# Patient Record
Sex: Female | Born: 1970 | Race: White | Hispanic: No | Smoking: Current every day smoker
Health system: Southern US, Community
[De-identification: ages and names within clinical notes are randomized; demographics above are authoritative.]

## PROBLEM LIST (undated history)

## (undated) DIAGNOSIS — F1910 Other psychoactive substance abuse, uncomplicated: Secondary | ICD-10-CM

## (undated) DIAGNOSIS — I10 Essential (primary) hypertension: Secondary | ICD-10-CM

## (undated) DIAGNOSIS — M545 Low back pain, unspecified: Secondary | ICD-10-CM

## (undated) DIAGNOSIS — G609 Hereditary and idiopathic neuropathy, unspecified: Secondary | ICD-10-CM

## (undated) DIAGNOSIS — M1990 Unspecified osteoarthritis, unspecified site: Secondary | ICD-10-CM

## (undated) DIAGNOSIS — M170 Bilateral primary osteoarthritis of knee: Secondary | ICD-10-CM

## (undated) DIAGNOSIS — J439 Emphysema, unspecified: Secondary | ICD-10-CM

## (undated) DIAGNOSIS — F419 Anxiety disorder, unspecified: Secondary | ICD-10-CM

## (undated) DIAGNOSIS — K219 Gastro-esophageal reflux disease without esophagitis: Secondary | ICD-10-CM

## (undated) DIAGNOSIS — N189 Chronic kidney disease, unspecified: Secondary | ICD-10-CM

## (undated) DIAGNOSIS — E119 Type 2 diabetes mellitus without complications: Secondary | ICD-10-CM

## (undated) DIAGNOSIS — G709 Myoneural disorder, unspecified: Secondary | ICD-10-CM

## (undated) DIAGNOSIS — I639 Cerebral infarction, unspecified: Secondary | ICD-10-CM

## (undated) DIAGNOSIS — L739 Follicular disorder, unspecified: Secondary | ICD-10-CM

## (undated) DIAGNOSIS — N3941 Urge incontinence: Secondary | ICD-10-CM

## (undated) DIAGNOSIS — E785 Hyperlipidemia, unspecified: Secondary | ICD-10-CM

## (undated) DIAGNOSIS — D890 Polyclonal hypergammaglobulinemia: Secondary | ICD-10-CM

## (undated) DIAGNOSIS — D72829 Elevated white blood cell count, unspecified: Secondary | ICD-10-CM

## (undated) DIAGNOSIS — R700 Elevated erythrocyte sedimentation rate: Secondary | ICD-10-CM

## (undated) DIAGNOSIS — J449 Chronic obstructive pulmonary disease, unspecified: Secondary | ICD-10-CM

## (undated) DIAGNOSIS — M7580 Other shoulder lesions, unspecified shoulder: Secondary | ICD-10-CM

## (undated) DIAGNOSIS — D759 Disease of blood and blood-forming organs, unspecified: Secondary | ICD-10-CM

## (undated) HISTORY — DX: Other psychoactive substance abuse, uncomplicated: F19.10

## (undated) HISTORY — DX: Type 2 diabetes mellitus without complications: E11.9

## (undated) HISTORY — DX: Disease of blood and blood-forming organs, unspecified: D75.9

## (undated) HISTORY — DX: Anxiety disorder, unspecified: F41.9

## (undated) HISTORY — DX: Hyperlipidemia, unspecified: E78.5

## (undated) HISTORY — DX: Low back pain, unspecified: M54.50

## (undated) HISTORY — DX: Urge incontinence: N39.41

## (undated) HISTORY — DX: Elevated erythrocyte sedimentation rate: R70.0

## (undated) HISTORY — DX: Bilateral primary osteoarthritis of knee: M17.0

## (undated) HISTORY — PX: CORNEAL TRANSPLANT: SHX108

## (undated) HISTORY — PX: CHOLECYSTECTOMY: SHX55

## (undated) HISTORY — DX: Cerebral infarction, unspecified: I63.9

## (undated) HISTORY — DX: Follicular disorder, unspecified: L73.9

## (undated) HISTORY — DX: Hereditary and idiopathic neuropathy, unspecified: G60.9

## (undated) HISTORY — DX: Emphysema, unspecified: J43.9

## (undated) HISTORY — DX: Myoneural disorder, unspecified: G70.9

## (undated) HISTORY — DX: Chronic kidney disease, unspecified: N18.9

## (undated) HISTORY — DX: Unspecified osteoarthritis, unspecified site: M19.90

## (undated) HISTORY — PX: DILATION AND CURETTAGE OF UTERUS: SHX78

## (undated) HISTORY — DX: Gastro-esophageal reflux disease without esophagitis: K21.9

## (undated) HISTORY — DX: Polyclonal hypergammaglobulinemia: D89.0

## (undated) HISTORY — DX: Essential (primary) hypertension: I10

## (undated) HISTORY — DX: Chronic obstructive pulmonary disease, unspecified: J44.9

## (undated) HISTORY — DX: Elevated white blood cell count, unspecified: D72.829

## (undated) HISTORY — DX: Low back pain: M54.5

## (undated) HISTORY — DX: Other shoulder lesions, unspecified shoulder: M75.80

## (undated) HISTORY — PX: TONSILECTOMY, ADENOIDECTOMY, BILATERAL MYRINGOTOMY AND TUBES: SHX2538

---

## 2005-01-23 ENCOUNTER — Emergency Department (HOSPITAL_COMMUNITY): Admission: EM | Admit: 2005-01-23 | Discharge: 2005-01-23 | Payer: Self-pay | Admitting: Emergency Medicine

## 2005-03-03 ENCOUNTER — Emergency Department (HOSPITAL_COMMUNITY): Admission: EM | Admit: 2005-03-03 | Discharge: 2005-03-03 | Payer: Self-pay | Admitting: Emergency Medicine

## 2005-05-18 ENCOUNTER — Emergency Department (HOSPITAL_COMMUNITY): Admission: EM | Admit: 2005-05-18 | Discharge: 2005-05-18 | Payer: Self-pay | Admitting: Emergency Medicine

## 2005-05-25 ENCOUNTER — Observation Stay (HOSPITAL_COMMUNITY): Admission: EM | Admit: 2005-05-25 | Discharge: 2005-05-26 | Payer: Self-pay | Admitting: Emergency Medicine

## 2005-06-21 ENCOUNTER — Emergency Department: Payer: Self-pay | Admitting: Emergency Medicine

## 2005-07-18 ENCOUNTER — Emergency Department (HOSPITAL_COMMUNITY): Admission: EM | Admit: 2005-07-18 | Discharge: 2005-07-18 | Payer: Self-pay | Admitting: Emergency Medicine

## 2005-07-20 ENCOUNTER — Ambulatory Visit: Payer: Self-pay

## 2005-08-22 ENCOUNTER — Emergency Department: Payer: Self-pay | Admitting: Emergency Medicine

## 2005-08-24 ENCOUNTER — Emergency Department: Payer: Self-pay | Admitting: Unknown Physician Specialty

## 2005-09-21 ENCOUNTER — Emergency Department: Payer: Self-pay | Admitting: Emergency Medicine

## 2005-12-13 ENCOUNTER — Emergency Department: Payer: Self-pay | Admitting: Emergency Medicine

## 2006-02-13 ENCOUNTER — Emergency Department: Payer: Self-pay | Admitting: Internal Medicine

## 2006-02-14 ENCOUNTER — Ambulatory Visit: Payer: Self-pay | Admitting: Internal Medicine

## 2006-03-25 ENCOUNTER — Emergency Department: Payer: Self-pay | Admitting: Emergency Medicine

## 2006-03-25 ENCOUNTER — Other Ambulatory Visit: Payer: Self-pay

## 2006-03-29 ENCOUNTER — Emergency Department: Payer: Self-pay | Admitting: Emergency Medicine

## 2006-06-08 ENCOUNTER — Emergency Department: Payer: Self-pay | Admitting: Emergency Medicine

## 2006-06-13 ENCOUNTER — Other Ambulatory Visit: Payer: Self-pay

## 2006-06-13 ENCOUNTER — Emergency Department: Payer: Self-pay | Admitting: Emergency Medicine

## 2006-07-29 ENCOUNTER — Ambulatory Visit: Payer: Self-pay | Admitting: Ophthalmology

## 2006-12-27 ENCOUNTER — Emergency Department: Payer: Self-pay | Admitting: Emergency Medicine

## 2007-02-24 ENCOUNTER — Emergency Department: Payer: Self-pay

## 2007-02-24 ENCOUNTER — Other Ambulatory Visit: Payer: Self-pay

## 2007-03-20 ENCOUNTER — Ambulatory Visit: Payer: Self-pay | Admitting: Rheumatology

## 2007-03-30 ENCOUNTER — Ambulatory Visit: Payer: Self-pay | Admitting: Internal Medicine

## 2007-04-05 ENCOUNTER — Emergency Department: Payer: Self-pay | Admitting: Emergency Medicine

## 2007-04-10 ENCOUNTER — Ambulatory Visit: Payer: Self-pay | Admitting: Internal Medicine

## 2007-04-29 ENCOUNTER — Ambulatory Visit: Payer: Self-pay | Admitting: Internal Medicine

## 2007-05-17 ENCOUNTER — Emergency Department: Payer: Self-pay | Admitting: Emergency Medicine

## 2007-05-29 ENCOUNTER — Emergency Department: Payer: Self-pay

## 2007-05-30 ENCOUNTER — Ambulatory Visit: Payer: Self-pay | Admitting: Internal Medicine

## 2007-08-07 ENCOUNTER — Emergency Department: Payer: Self-pay | Admitting: Emergency Medicine

## 2007-11-14 ENCOUNTER — Emergency Department: Payer: Self-pay | Admitting: Emergency Medicine

## 2007-12-06 ENCOUNTER — Ambulatory Visit: Payer: Self-pay

## 2008-01-28 ENCOUNTER — Emergency Department: Payer: Self-pay | Admitting: Emergency Medicine

## 2008-04-22 ENCOUNTER — Other Ambulatory Visit: Payer: Self-pay

## 2008-04-22 ENCOUNTER — Emergency Department: Payer: Self-pay | Admitting: Internal Medicine

## 2008-05-01 ENCOUNTER — Emergency Department: Payer: Self-pay

## 2008-07-27 ENCOUNTER — Emergency Department: Payer: Self-pay | Admitting: Emergency Medicine

## 2008-07-27 ENCOUNTER — Ambulatory Visit: Payer: Self-pay | Admitting: Family Medicine

## 2008-08-17 ENCOUNTER — Emergency Department: Payer: Self-pay | Admitting: Unknown Physician Specialty

## 2008-08-20 ENCOUNTER — Emergency Department: Payer: Self-pay | Admitting: Emergency Medicine

## 2008-09-06 ENCOUNTER — Ambulatory Visit: Payer: Self-pay | Admitting: Orthopedic Surgery

## 2008-11-30 ENCOUNTER — Emergency Department: Payer: Self-pay | Admitting: Emergency Medicine

## 2008-12-06 ENCOUNTER — Emergency Department: Payer: Self-pay

## 2008-12-07 ENCOUNTER — Ambulatory Visit: Payer: Self-pay | Admitting: Family Medicine

## 2009-02-08 ENCOUNTER — Ambulatory Visit: Payer: Self-pay | Admitting: Family Medicine

## 2009-02-26 ENCOUNTER — Ambulatory Visit: Payer: Self-pay | Admitting: Family Medicine

## 2009-05-29 ENCOUNTER — Ambulatory Visit: Payer: Self-pay | Admitting: Internal Medicine

## 2009-08-01 ENCOUNTER — Emergency Department: Payer: Self-pay | Admitting: Emergency Medicine

## 2009-10-22 ENCOUNTER — Emergency Department: Payer: Self-pay | Admitting: Emergency Medicine

## 2009-12-09 ENCOUNTER — Emergency Department: Payer: Self-pay | Admitting: Emergency Medicine

## 2010-03-02 ENCOUNTER — Ambulatory Visit: Payer: Self-pay | Admitting: Unknown Physician Specialty

## 2010-03-08 ENCOUNTER — Ambulatory Visit: Payer: Self-pay | Admitting: Unknown Physician Specialty

## 2010-05-20 ENCOUNTER — Emergency Department: Payer: Self-pay | Admitting: Emergency Medicine

## 2010-06-29 ENCOUNTER — Ambulatory Visit: Payer: Self-pay | Admitting: Internal Medicine

## 2010-10-03 ENCOUNTER — Emergency Department: Payer: Self-pay | Admitting: Emergency Medicine

## 2011-01-27 ENCOUNTER — Emergency Department: Payer: Self-pay | Admitting: Emergency Medicine

## 2011-02-05 ENCOUNTER — Emergency Department: Payer: Self-pay | Admitting: Emergency Medicine

## 2011-02-28 ENCOUNTER — Emergency Department: Payer: Self-pay | Admitting: Emergency Medicine

## 2011-07-22 ENCOUNTER — Emergency Department: Payer: Self-pay | Admitting: *Deleted

## 2012-11-10 ENCOUNTER — Emergency Department: Payer: Self-pay | Admitting: Emergency Medicine

## 2012-11-18 ENCOUNTER — Inpatient Hospital Stay: Payer: Self-pay | Admitting: Internal Medicine

## 2012-11-18 LAB — URINALYSIS, COMPLETE
Bilirubin,UR: NEGATIVE
Blood: NEGATIVE
Glucose,UR: >=500 mg/dL (ref 0–75)
Leukocyte Esterase: NEGATIVE
Ph: 6 (ref 4.5–8.0)
RBC,UR: <1 /HPF (ref 0–5)
Squamous Epithelial: 2
WBC UR: 1 /HPF (ref 0–5)

## 2012-11-18 LAB — CBC WITH DIFFERENTIAL/PLATELET
Lymphocyte #: 2.7 10*3/uL (ref 1.0–3.6)
MCH: 25.5 pg — ABNORMAL LOW (ref 26.0–34.0)
MCV: 77 fL — ABNORMAL LOW (ref 80–100)
RBC: 5.16 10*6/uL (ref 3.80–5.20)
WBC: 13.4 10*3/uL — ABNORMAL HIGH (ref 3.6–11.0)

## 2012-11-18 LAB — COMPREHENSIVE METABOLIC PANEL
Albumin: 2.9 g/dL — ABNORMAL LOW (ref 3.4–5.0)
Alkaline Phosphatase: 167 U/L — ABNORMAL HIGH (ref 50–136)
Anion Gap: 7 (ref 7–16)
Bilirubin,Total: 0.3 mg/dL (ref 0.2–1.0)
Chloride: 105 mmol/L (ref 98–107)
Potassium: 4.5 mmol/L (ref 3.5–5.1)
SGOT(AST): 23 U/L (ref 15–37)

## 2012-11-18 LAB — HEMOGLOBIN A1C: Hemoglobin A1C: 9.3 % — ABNORMAL HIGH (ref 4.2–6.3)

## 2012-11-19 LAB — CBC WITH DIFFERENTIAL/PLATELET
Eosinophil %: 5 %
HGB: 11.9 g/dL — ABNORMAL LOW (ref 12.0–16.0)
Monocyte #: 0.7 x10 3/mm (ref 0.2–0.9)
Neutrophil %: 65.3 %
WBC: 9.5 10*3/uL (ref 3.6–11.0)

## 2012-11-19 LAB — BASIC METABOLIC PANEL
BUN: 18 mg/dL (ref 7–18)
Calcium, Total: 8.7 mg/dL (ref 8.5–10.1)
Creatinine: 1.17 mg/dL (ref 0.60–1.30)
Glucose: 200 mg/dL — ABNORMAL HIGH (ref 65–99)
Osmolality: 281 (ref 275–301)
Potassium: 4.4 mmol/L (ref 3.5–5.1)

## 2012-11-23 LAB — CULTURE, BLOOD (SINGLE)

## 2013-03-02 ENCOUNTER — Ambulatory Visit: Payer: Self-pay | Admitting: Neurology

## 2013-03-04 ENCOUNTER — Encounter: Payer: Self-pay | Admitting: Family Medicine

## 2013-03-04 ENCOUNTER — Ambulatory Visit (INDEPENDENT_AMBULATORY_CARE_PROVIDER_SITE_OTHER): Payer: Medicare Other | Admitting: Family Medicine

## 2013-03-04 VITALS — BP 140/85 | HR 79 | Ht 65.0 in | Wt 253.0 lb

## 2013-03-04 DIAGNOSIS — F419 Anxiety disorder, unspecified: Secondary | ICD-10-CM

## 2013-03-04 DIAGNOSIS — E66813 Obesity, class 3: Secondary | ICD-10-CM

## 2013-03-04 DIAGNOSIS — F17209 Nicotine dependence, unspecified, with unspecified nicotine-induced disorders: Secondary | ICD-10-CM

## 2013-03-04 DIAGNOSIS — E1140 Type 2 diabetes mellitus with diabetic neuropathy, unspecified: Secondary | ICD-10-CM

## 2013-03-04 DIAGNOSIS — F17200 Nicotine dependence, unspecified, uncomplicated: Secondary | ICD-10-CM

## 2013-03-04 DIAGNOSIS — M1990 Unspecified osteoarthritis, unspecified site: Secondary | ICD-10-CM

## 2013-03-04 DIAGNOSIS — N059 Unspecified nephritic syndrome with unspecified morphologic changes: Secondary | ICD-10-CM

## 2013-03-04 DIAGNOSIS — F1290 Cannabis use, unspecified, uncomplicated: Secondary | ICD-10-CM

## 2013-03-04 DIAGNOSIS — E119 Type 2 diabetes mellitus without complications: Secondary | ICD-10-CM

## 2013-03-04 DIAGNOSIS — F1210 Cannabis abuse, uncomplicated: Secondary | ICD-10-CM

## 2013-03-04 DIAGNOSIS — M129 Arthropathy, unspecified: Secondary | ICD-10-CM

## 2013-03-04 DIAGNOSIS — T868499 Unspecified complication of corneal transplant, unspecified eye: Secondary | ICD-10-CM

## 2013-03-04 DIAGNOSIS — Z8742 Personal history of other diseases of the female genital tract: Secondary | ICD-10-CM

## 2013-03-04 DIAGNOSIS — I6350 Cerebral infarction due to unspecified occlusion or stenosis of unspecified cerebral artery: Secondary | ICD-10-CM

## 2013-03-04 DIAGNOSIS — D759 Disease of blood and blood-forming organs, unspecified: Secondary | ICD-10-CM

## 2013-03-04 DIAGNOSIS — K219 Gastro-esophageal reflux disease without esophagitis: Secondary | ICD-10-CM

## 2013-03-04 DIAGNOSIS — N920 Excessive and frequent menstruation with regular cycle: Secondary | ICD-10-CM

## 2013-03-04 DIAGNOSIS — E1142 Type 2 diabetes mellitus with diabetic polyneuropathy: Secondary | ICD-10-CM

## 2013-03-04 DIAGNOSIS — F411 Generalized anxiety disorder: Secondary | ICD-10-CM

## 2013-03-04 DIAGNOSIS — E1149 Type 2 diabetes mellitus with other diabetic neurological complication: Secondary | ICD-10-CM

## 2013-03-04 DIAGNOSIS — J449 Chronic obstructive pulmonary disease, unspecified: Secondary | ICD-10-CM

## 2013-03-04 DIAGNOSIS — N946 Dysmenorrhea, unspecified: Secondary | ICD-10-CM

## 2013-03-04 DIAGNOSIS — I639 Cerebral infarction, unspecified: Secondary | ICD-10-CM

## 2013-03-04 DIAGNOSIS — I10 Essential (primary) hypertension: Secondary | ICD-10-CM

## 2013-03-04 DIAGNOSIS — N289 Disorder of kidney and ureter, unspecified: Secondary | ICD-10-CM

## 2013-03-04 NOTE — Patient Instructions (Signed)
Menorrhagia Dysfunctional uterine bleeding is different from a normal menstrual period. When periods are heavy or there is more bleeding than is usual for you, it is called menorrhagia. It may be caused by hormonal imbalance, or physical, metabolic, or other problems. Examination is necessary in order that your caregiver may treat treatable causes. If this is a continuing problem, a D&C may be needed. That means that the cervix (the opening of the uterus or womb) is dilated (stretched larger) and the lining of the uterus is scraped out. The tissue scraped out is then examined under a microscope by a specialist (pathologist) to make sure there is nothing of concern that needs further or more extensive treatment. HOME CARE INSTRUCTIONS   If medications were prescribed, take exactly as directed. Do not change or switch medications without consulting your caregiver.  Long term heavy bleeding may result in iron deficiency. Your caregiver may have prescribed iron pills. They help replace the iron your body lost from heavy bleeding. Take exactly as directed. Iron may cause constipation. If this becomes a problem, increase the bran, fruits, and roughage in your diet.  Do not take aspirin or medicines that contain aspirin one week before or during your menstrual period. Aspirin may make the bleeding worse.  If you need to change your sanitary pad or tampon more than once every 2 hours, stay in bed and rest as much as possible until the bleeding stops.  Eat well-balanced meals. Eat foods high in iron. Examples are leafy green vegetables, meat, liver, eggs, and whole grain breads and cereals. Do not try to lose weight until the abnormal bleeding has stopped and your blood iron level is back to normal. SEEK MEDICAL CARE IF:   You need to change your sanitary pad or tampon more than once an hour.  You develop nausea (feeling sick to your stomach) and vomiting, dizziness, or diarrhea while you are taking your  medicine.  You have any problems that may be related to the medicine you are taking. SEEK IMMEDIATE MEDICAL CARE IF:   You have a fever.  You develop chills.  You develop severe bleeding or start to pass blood clots.  You feel dizzy or faint. MAKE SURE YOU:   Understand these instructions.  Will watch your condition.  Will get help right away if you are not doing well or get worse. Document Released: 10/15/2005 Document Revised: 01/07/2012 Document Reviewed: 06/04/2008 [facility] Patient Information 2013 [place], [place]. Levonorgestrel intrauterine device (IUD) What is this medicine? LEVONORGESTREL IUD (LEE voe nor jes trel) is a contraceptive (birth control) device. The device is placed inside the uterus by a healthcare professional. It is used to prevent pregnancy and can also be used to treat heavy bleeding that occurs during your period. Depending on the device, it can be used for 3 to 5 years. This medicine may be used for other purposes; ask your health care provider or pharmacist if you have questions. What should I tell my health care provider before I take this medicine? They need to know if you have any of these conditions: -abnormal Pap smear -cancer of the breast, uterus, or cervix -diabetes -endometritis -genital or pelvic infection now or in the past -have more than one sexual partner or your partner has more than one partner -heart disease -history of an ectopic or tubal pregnancy -immune system problems -IUD in place -liver disease or tumor -problems with blood clots or take blood-thinners -use intravenous drugs -uterus of unusual shape -vaginal bleeding that  has not been explained -an unusual or allergic reaction to levonorgestrel, other hormones, silicone, or polyethylene, medicines, foods, dyes, or preservatives -pregnant or trying to get pregnant -breast-feeding How should I use this medicine? This device is placed inside the uterus by a health care  professional. Talk to your pediatrician regarding the use of this medicine in children. Special care may be needed. Overdosage: If you think you have taken too much of this medicine contact a poison control center or emergency room at once. NOTE: This medicine is only for you. Do not share this medicine with others. What if I miss a dose? This does not apply. What may interact with this medicine? Do not take this medicine with any of the following medications: -amprenavir -bosentan -fosamprenavir This medicine may also interact with the following medications: -aprepitant -barbiturate medicines for inducing sleep or treating seizures -bexarotene -griseofulvin -medicines to treat seizures like carbamazepine, ethotoin, felbamate, oxcarbazepine, phenytoin, topiramate -modafinil -pioglitazone -rifabutin -rifampin -rifapentine -some medicines to treat HIV infection like atazanavir, indinavir, lopinavir, nelfinavir, tipranavir, ritonavir -St. John's wort -warfarin This list may not describe all possible interactions. Give your health care provider a list of all the medicines, herbs, non-prescription drugs, or dietary supplements you use. Also tell them if you smoke, drink alcohol, or use illegal drugs. Some items may interact with your medicine. What should I watch for while using this medicine? Visit your doctor or health care professional for regular check ups. See your doctor if you or your partner has sexual contact with others, becomes HIV positive, or gets a sexual transmitted disease. This product does not protect you against HIV infection (AIDS) or other sexually transmitted diseases. You can check the placement of the IUD yourself by reaching up to the top of your vagina with clean fingers to feel the threads. Do not pull on the threads. It is a good habit to check placement after each menstrual period. Call your doctor right away if you feel more of the IUD than just the threads or if  you cannot feel the threads at all. The IUD may come out by itself. You may become pregnant if the device comes out. If you notice that the IUD has come out use a backup birth control method like condoms and call your health care provider. Using tampons will not change the position of the IUD and are okay to use during your period. What side effects may I notice from receiving this medicine? Side effects that you should report to your doctor or health care professional as soon as possible: -allergic reactions like skin rash, itching or hives, swelling of the face, lips, or tongue -fever, flu-like symptoms -genital sores -high blood pressure -no menstrual period for 6 weeks during use -pain, swelling, warmth in the leg -pelvic pain or tenderness -severe or sudden headache -signs of pregnancy -stomach cramping -sudden shortness of breath -trouble with balance, talking, or walking -unusual vaginal bleeding, discharge -yellowing of the eyes or skin Side effects that usually do not require medical attention (report to your doctor or health care professional if they continue or are bothersome): -acne -breast pain -change in sex drive or performance -changes in weight -cramping, dizziness, or faintness while the device is being inserted -headache -irregular menstrual bleeding within first 3 to 6 months of use -nausea This list may not describe all possible side effects. Call your doctor for medical advice about side effects. You may report side effects to FDA at 1-800-FDA-1088. Where should I keep my medicine?  This does not apply. NOTE: This sheet is a summary. It may not cover all possible information. If you have questions about this medicine, talk to your doctor, pharmacist, or health care provider.  2013, [employer]vier/Gold Standard. (11/15/2011 1:54:04 PM) Hysterectomy Information  A hysterectomy is a procedure where your uterus is surgically removed. It will no longer be possible to have  menstrual periods or to become pregnant. The tubes and ovaries can be removed (bilateral salpingo-oopherectomy) during this surgery as well.  REASONS FOR A HYSTERECTOMY  Persistent, abnormal bleeding.  Lasting (chronic) pelvic pain or infection.  The lining of the uterus (endometrium) starts growing outside the uterus (endometriosis).  The endometrium starts growing in the muscle of the uterus (adenomyosis).  The uterus falls down into the vagina (pelvic organ prolapse).  Symptomatic uterine fibroids.  Precancerous cells.  Cervical cancer or uterine cancer. TYPES OF HYSTERECTOMIES  Supracervical hysterectomy. This type removes the top part of the uterus, but not the cervix.  Total hysterectomy. This type removes the uterus and cervix.  Radical hysterectomy. This type removes the uterus, cervix, and the fibrous tissue that holds the uterus in place in the pelvis (parametrium). WAYS A HYSTERECTOMY CAN BE PERFORMED  Abdominal hysterectomy. A large surgical cut (incision) is made in the abdomen. The uterus is removed through this incision.  Vaginal hysterectomy. An incision is made in the vagina. The uterus is removed through this incision. There are no abdominal incisions.  Conventional laparoscopic hysterectomy. A thin, lighted tube with a camera (laparoscope) is inserted into 3 or 4 small incisions in the abdomen. The uterus is cut into small pieces. The small pieces are removed through the incisions, or they are removed through the vagina.  Laparoscopic assisted vaginal hysterectomy (LAVH). Three or four small incisions are made in the abdomen. Part of the surgery is performed laparoscopically and part vaginally. The uterus is removed through the vagina.  Robot-assisted laparoscopic hysterectomy. A laparoscope is inserted into 3 or 4 small incisions in the abdomen. A computer-controlled device is used to give the surgeon a 3D image. This allows for more precise movements of  surgical instruments. The uterus is cut into small pieces and removed through the incisions or removed through the vagina. RISKS OF HYSTERECTOMY   Bleeding and risk of blood transfusion. Tell your caregiver if you do not want to receive any blood products.  Blood clots in the legs or lung.  Infection.  Injury to surrounding organs.  Anesthesia problems or side effects.  Conversion to an abdominal hysterectomy. WHAT TO EXPECT AFTER A HYSTERECTOMY  You will be given pain medicine.  You will need to have someone with you for the first 3 to 5 days after you go home.  You will need to follow up with your surgeon in 2 to 4 weeks after surgery to evaluate your progress.  You may have early menopause symptoms like hot flashes, night sweats, and insomnia.  If you had a hysterectomy for a problem that was not a cancer or a condition that could lead to cancer, then you no longer need Pap tests. However, even if you no longer need a Pap test, a regular exam is a good idea to make sure no other problems are starting. Document Released: 04/10/2001 Document Revised: 01/07/2012 Document Reviewed: 05/26/2011 [facility] Patient Information 2013 [place], [place].

## 2013-03-04 NOTE — Progress Notes (Signed)
Periods are becoming heavier and more painful over the last two years.  Seen at [facility] and discussed hysterectomy,  She was afraid at that point but feels like it is getting worse and would like to discuss her options.

## 2013-03-05 DIAGNOSIS — I10 Essential (primary) hypertension: Secondary | ICD-10-CM | POA: Insufficient documentation

## 2013-03-05 DIAGNOSIS — J449 Chronic obstructive pulmonary disease, unspecified: Secondary | ICD-10-CM | POA: Insufficient documentation

## 2013-03-05 DIAGNOSIS — F419 Anxiety disorder, unspecified: Secondary | ICD-10-CM | POA: Insufficient documentation

## 2013-03-05 DIAGNOSIS — E119 Type 2 diabetes mellitus without complications: Secondary | ICD-10-CM | POA: Insufficient documentation

## 2013-03-05 DIAGNOSIS — N946 Dysmenorrhea, unspecified: Secondary | ICD-10-CM | POA: Insufficient documentation

## 2013-03-05 DIAGNOSIS — I639 Cerebral infarction, unspecified: Secondary | ICD-10-CM | POA: Insufficient documentation

## 2013-03-05 DIAGNOSIS — K219 Gastro-esophageal reflux disease without esophagitis: Secondary | ICD-10-CM | POA: Insufficient documentation

## 2013-03-05 DIAGNOSIS — E1140 Type 2 diabetes mellitus with diabetic neuropathy, unspecified: Secondary | ICD-10-CM | POA: Insufficient documentation

## 2013-03-05 DIAGNOSIS — N289 Disorder of kidney and ureter, unspecified: Secondary | ICD-10-CM | POA: Insufficient documentation

## 2013-03-05 DIAGNOSIS — M1990 Unspecified osteoarthritis, unspecified site: Secondary | ICD-10-CM | POA: Insufficient documentation

## 2013-03-05 DIAGNOSIS — F1290 Cannabis use, unspecified, uncomplicated: Secondary | ICD-10-CM | POA: Insufficient documentation

## 2013-03-05 DIAGNOSIS — F17209 Nicotine dependence, unspecified, with unspecified nicotine-induced disorders: Secondary | ICD-10-CM | POA: Insufficient documentation

## 2013-03-05 DIAGNOSIS — Z8742 Personal history of other diseases of the female genital tract: Secondary | ICD-10-CM | POA: Insufficient documentation

## 2013-03-05 DIAGNOSIS — D759 Disease of blood and blood-forming organs, unspecified: Secondary | ICD-10-CM | POA: Insufficient documentation

## 2013-03-05 DIAGNOSIS — N920 Excessive and frequent menstruation with regular cycle: Secondary | ICD-10-CM | POA: Insufficient documentation

## 2013-03-05 NOTE — Assessment & Plan Note (Signed)
Unclear etiology, pt. Reports nml EMB and U/S, but will attempt to get records to confirm.  Pt. Is desiring permanent treatment with hysterectomy, however, pt. Is not a good surgical candidate for several reasons.  Overall health, COPD, ongoing smoking, poorly controlled DM with complications and likely poor wound healing, other unqualified blood disorder, h/o stroke.  Discussed this at length with the pt.  She would benefit from trial of conservative management, at the least.  Prefer IUD, as office procedure, 5 years of treatment and no new medication to add to her substantial list.  Will await records and f/u in 4 wks.
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
## 2013-04-01 ENCOUNTER — Ambulatory Visit: Payer: Medicare Other | Admitting: Family Medicine

## 2014-01-27 ENCOUNTER — Emergency Department: Payer: Self-pay

## 2014-01-27 LAB — BASIC METABOLIC PANEL
Anion Gap: 5 — ABNORMAL LOW (ref 7–16)
BUN: 9 mg/dL (ref 7–18)
CHLORIDE: 102 mmol/L (ref 98–107)
CO2: 28 mmol/L (ref 21–32)
Calcium, Total: 9 mg/dL (ref 8.5–10.1)
Creatinine: 0.92 mg/dL (ref 0.60–1.30)
EGFR (African American): >60
EGFR (Non-African Amer.): >60
Glucose: 251 mg/dL — ABNORMAL HIGH (ref 65–99)
Osmolality: 277 (ref 275–301)
Potassium: 4.3 mmol/L (ref 3.5–5.1)
Sodium: 135 mmol/L — ABNORMAL LOW (ref 136–145)

## 2014-01-27 LAB — CBC
HCT: 41.4 % (ref 35.0–47.0)
HGB: 12.8 g/dL (ref 12.0–16.0)
MCH: 23.1 pg — ABNORMAL LOW (ref 26.0–34.0)
MCHC: 31.1 g/dL — ABNORMAL LOW (ref 32.0–36.0)
MCV: 74 fL — AB (ref 80–100)
PLATELETS: 388 10*3/uL (ref 150–440)
RBC: 5.56 10*6/uL — ABNORMAL HIGH (ref 3.80–5.20)
RDW: 17.9 % — AB (ref 11.5–14.5)
WBC: 16.4 10*3/uL — ABNORMAL HIGH (ref 3.6–11.0)

## 2014-01-27 LAB — URINALYSIS, COMPLETE
Bacteria: NONE SEEN
Bilirubin,UR: NEGATIVE
Blood: NEGATIVE
Glucose,UR: >=500 mg/dL (ref 0–75)
KETONE: NEGATIVE
LEUKOCYTE ESTERASE: NEGATIVE
NITRITE: NEGATIVE
PH: 6 (ref 4.5–8.0)
Protein: NEGATIVE
RBC,UR: NONE SEEN /HPF (ref 0–5)
Specific Gravity: 1.02 (ref 1.003–1.030)
WBC UR: 1 /HPF (ref 0–5)

## 2014-07-26 ENCOUNTER — Emergency Department: Payer: Self-pay | Admitting: Emergency Medicine

## 2014-07-26 LAB — COMPREHENSIVE METABOLIC PANEL
ALBUMIN: 2.6 g/dL — AB (ref 3.4–5.0)
ALK PHOS: 157 U/L — AB
ALT: 22 U/L
ANION GAP: 8 (ref 7–16)
BUN: 12 mg/dL (ref 7–18)
Bilirubin,Total: 0.2 mg/dL (ref 0.2–1.0)
CALCIUM: 9.3 mg/dL (ref 8.5–10.1)
CO2: 27 mmol/L (ref 21–32)
Chloride: 101 mmol/L (ref 98–107)
Creatinine: 0.95 mg/dL (ref 0.60–1.30)
Glucose: 159 mg/dL — ABNORMAL HIGH (ref 65–99)
OSMOLALITY: 275 (ref 275–301)
POTASSIUM: 3.8 mmol/L (ref 3.5–5.1)
SGOT(AST): 17 U/L (ref 15–37)
Sodium: 136 mmol/L (ref 136–145)
Total Protein: 8.2 g/dL (ref 6.4–8.2)

## 2014-07-26 LAB — URINALYSIS, COMPLETE
BACTERIA: NONE SEEN
BILIRUBIN, UR: NEGATIVE
Blood: NEGATIVE
GLUCOSE, UR: NEGATIVE mg/dL (ref 0–75)
KETONE: NEGATIVE
Leukocyte Esterase: NEGATIVE
Nitrite: NEGATIVE
PH: 6 (ref 4.5–8.0)
PROTEIN: NEGATIVE
SPECIFIC GRAVITY: 1.01 (ref 1.003–1.030)
Squamous Epithelial: 1
WBC UR: <1 /HPF (ref 0–5)

## 2014-07-26 LAB — CBC
HCT: 40.9 % (ref 35.0–47.0)
HGB: 12.7 g/dL (ref 12.0–16.0)
MCH: 23.8 pg — AB (ref 26.0–34.0)
MCHC: 31.1 g/dL — AB (ref 32.0–36.0)
MCV: 76 fL — ABNORMAL LOW (ref 80–100)
Platelet: 455 10*3/uL — ABNORMAL HIGH (ref 150–440)
RBC: 5.36 10*6/uL — ABNORMAL HIGH (ref 3.80–5.20)
RDW: 17.3 % — ABNORMAL HIGH (ref 11.5–14.5)
WBC: 17.9 10*3/uL — ABNORMAL HIGH (ref 3.6–11.0)

## 2014-07-26 LAB — CK: CK, Total: 27 U/L

## 2014-07-26 LAB — LIPASE, BLOOD: LIPASE: 143 U/L (ref 73–393)

## 2014-08-29 ENCOUNTER — Inpatient Hospital Stay: Payer: Self-pay | Admitting: Internal Medicine

## 2014-08-29 LAB — COMPREHENSIVE METABOLIC PANEL
ALBUMIN: 3 g/dL — AB (ref 3.4–5.0)
ANION GAP: 18 — AB (ref 7–16)
AST: 26 U/L (ref 15–37)
Alkaline Phosphatase: 161 U/L — ABNORMAL HIGH
BILIRUBIN TOTAL: 1 mg/dL (ref 0.2–1.0)
BUN: 17 mg/dL (ref 7–18)
CALCIUM: 9.6 mg/dL (ref 8.5–10.1)
CHLORIDE: 84 mmol/L — AB (ref 98–107)
CO2: 27 mmol/L (ref 21–32)
Creatinine: 1.99 mg/dL — ABNORMAL HIGH (ref 0.60–1.30)
GFR CALC AF AMER: 36 — AB
GFR CALC NON AF AMER: 29 — AB
Glucose: 350 mg/dL — ABNORMAL HIGH (ref 65–99)
OSMOLALITY: 274 (ref 275–301)
POTASSIUM: 2.6 mmol/L — AB (ref 3.5–5.1)
SGPT (ALT): 31 U/L
Sodium: 129 mmol/L — ABNORMAL LOW (ref 136–145)
TOTAL PROTEIN: 9 g/dL — AB (ref 6.4–8.2)

## 2014-08-29 LAB — CBC WITH DIFFERENTIAL/PLATELET
BASOS PCT: 0.7 %
Basophil #: 0.1 10*3/uL (ref 0.0–0.1)
EOS ABS: 0.1 10*3/uL (ref 0.0–0.7)
EOS PCT: 0.3 %
HCT: 45 % (ref 35.0–47.0)
HGB: 14.5 g/dL (ref 12.0–16.0)
LYMPHS PCT: 7.8 %
Lymphocyte #: 1.6 10*3/uL (ref 1.0–3.6)
MCH: 24.6 pg — AB (ref 26.0–34.0)
MCHC: 32.3 g/dL (ref 32.0–36.0)
MCV: 76 fL — ABNORMAL LOW (ref 80–100)
MONOS PCT: 4.8 %
Monocyte #: 1 x10 3/mm — ABNORMAL HIGH (ref 0.2–0.9)
NEUTROS ABS: 17.3 10*3/uL — AB (ref 1.4–6.5)
Neutrophil %: 86.4 %
Platelet: 542 10*3/uL — ABNORMAL HIGH (ref 150–440)
RBC: 5.91 10*6/uL — AB (ref 3.80–5.20)
RDW: 17.1 % — ABNORMAL HIGH (ref 11.5–14.5)
WBC: 20 10*3/uL — AB (ref 3.6–11.0)

## 2014-08-29 LAB — URINALYSIS, COMPLETE
BLOOD: NEGATIVE
Bilirubin,UR: NEGATIVE
Glucose,UR: >=500 mg/dL (ref 0–75)
NITRITE: NEGATIVE
PH: 8 (ref 4.5–8.0)
Protein: 30
RBC,UR: 3 /HPF (ref 0–5)
Specific Gravity: 1.026 (ref 1.003–1.030)
WBC UR: 27 /HPF (ref 0–5)

## 2014-08-29 LAB — LIPASE, BLOOD: Lipase: 151 U/L (ref 73–393)

## 2014-08-29 LAB — MAGNESIUM: Magnesium: 1.5 mg/dL — ABNORMAL LOW

## 2014-08-29 LAB — TROPONIN I

## 2014-08-29 LAB — HCG, QUANTITATIVE, PREGNANCY: Beta Hcg, Quant.: <1 m[IU]/mL — ABNORMAL LOW

## 2014-08-30 ENCOUNTER — Encounter: Payer: Self-pay | Admitting: Family Medicine

## 2014-08-30 LAB — CBC WITH DIFFERENTIAL/PLATELET
BASOS PCT: 0.6 %
Basophil #: 0.1 10*3/uL (ref 0.0–0.1)
EOS ABS: 0.3 10*3/uL (ref 0.0–0.7)
Eosinophil %: 2.7 %
HCT: 37.2 % (ref 35.0–47.0)
HGB: 11.9 g/dL — ABNORMAL LOW (ref 12.0–16.0)
Lymphocyte #: 2.5 10*3/uL (ref 1.0–3.6)
Lymphocyte %: 20.8 %
MCH: 24.4 pg — ABNORMAL LOW (ref 26.0–34.0)
MCHC: 31.9 g/dL — AB (ref 32.0–36.0)
MCV: 76 fL — AB (ref 80–100)
MONO ABS: 0.8 x10 3/mm (ref 0.2–0.9)
MONOS PCT: 6.5 %
NEUTROS ABS: 8.5 10*3/uL — AB (ref 1.4–6.5)
Neutrophil %: 69.4 %
PLATELETS: 365 10*3/uL (ref 150–440)
RBC: 4.86 10*6/uL (ref 3.80–5.20)
RDW: 17.3 % — AB (ref 11.5–14.5)
WBC: 12.2 10*3/uL — ABNORMAL HIGH (ref 3.6–11.0)

## 2014-08-30 LAB — BASIC METABOLIC PANEL
ANION GAP: 6 — AB (ref 7–16)
BUN: 12 mg/dL (ref 7–18)
CHLORIDE: 93 mmol/L — AB (ref 98–107)
CREATININE: 1.52 mg/dL — AB (ref 0.60–1.30)
Calcium, Total: 8.3 mg/dL — ABNORMAL LOW (ref 8.5–10.1)
Co2: 34 mmol/L — ABNORMAL HIGH (ref 21–32)
EGFR (Non-African Amer.): 40 — ABNORMAL LOW
GFR CALC AF AMER: 49 — AB
GLUCOSE: 206 mg/dL — AB (ref 65–99)
Osmolality: 272 (ref 275–301)
Potassium: 2.9 mmol/L — ABNORMAL LOW (ref 3.5–5.1)
Sodium: 133 mmol/L — ABNORMAL LOW (ref 136–145)

## 2014-08-30 LAB — CLOSTRIDIUM DIFFICILE(ARMC)

## 2014-08-30 LAB — MAGNESIUM: Magnesium: 2.7 mg/dL — ABNORMAL HIGH

## 2014-08-30 LAB — TSH: Thyroid Stimulating Horm: 0.123 u[IU]/mL — ABNORMAL LOW

## 2014-08-31 LAB — BASIC METABOLIC PANEL
Anion Gap: 4 — ABNORMAL LOW (ref 7–16)
BUN: 11 mg/dL (ref 7–18)
CO2: 29 mmol/L (ref 21–32)
Calcium, Total: 8.5 mg/dL (ref 8.5–10.1)
Chloride: 104 mmol/L (ref 98–107)
Creatinine: 1.33 mg/dL — ABNORMAL HIGH (ref 0.60–1.30)
Glucose: 149 mg/dL — ABNORMAL HIGH (ref 65–99)
OSMOLALITY: 276 (ref 275–301)
Potassium: 3.8 mmol/L (ref 3.5–5.1)
Sodium: 137 mmol/L (ref 136–145)

## 2014-08-31 LAB — MAGNESIUM: Magnesium: 2.1 mg/dL

## 2014-08-31 LAB — WBCS, STOOL

## 2014-08-31 LAB — T4, FREE: FREE THYROXINE: 1.22 ng/dL (ref 0.76–1.46)

## 2014-09-01 LAB — STOOL CULTURE

## 2015-02-18 NOTE — H&P (Signed)
PATIENT NAME:  Denise Bass, Denise Bass MR#:  [id number] DATE OF BIRTH:  1971/06/29  DATE OF ADMISSION:  11/18/2012  PRIMARY CARE PHYSICIAN: Dr. Burnett ShengHedrick.   REFERRING PHYSICIAN: Dr. Helane RimaErica Wallace.   CHIEF COMPLAINT: Persistent pain at the site of the right flank boil.   HISTORY OF PRESENT ILLNESS: The patient is a morbidly obese 44 year old Caucasian female with multiple co-morbidities including corneal dystrophy, status post corneal surgery in the past, tobacco abuse, hypertension, hyperlipidemia, diabetes, recurrent folliculitis, who had a recurrent boil on the right side of the flank about 6 days ago with increased pain, swelling and chills as an outpatient. She went to her PCPs office a couple of days ago and on 01/19 it got I and D and the patient was given Septra. She was also given some Percocet for pain. She was noted to have elevated leukocytosis of 15,000 today and creatinine of 1.5 and therefore we were asked for direct admission prior by her PCPs office. The patient has decreased p.o. intake, pain, tenderness, chills and some vomiting today. There are no fevers.   PAST MEDICAL HISTORY: Hypertension, hyperlipidemia, diabetes, corneal dystrophy, keratitis and abnormal retina, status post corneal transplant, osteoarthritis of the knee, chronic back pain, history of recurrent folliculitis and boils, history of herpes simplex and history of polyclonal gammopathy. She had a cholecystectomy, obesity, history of miscarriages x 3, history of ovarian cyst and history of D and C.  ALLERGIES: PENICILLIN, VICODIN AND LATEX.   SOCIAL HISTORY: Two pack per day smoker. No alcohol or drug use.   FAMILY HISTORY: Mom with stroke and emphysema.   OUTPATIENT MEDICATIONS: Clonazepam 1 mg 2 times a day, gabapentin 400 mg two caps 3 times a day, glipizide 2.5 mg extended-release 1 tab 2 times a day, lisinopril 20 mg daily, metformin 500 mg 2 times a day, metoprolol tartrate 25 mg 2 times a day, trazodone 100 mg at  bedtime.   REVIEW OF SYSTEMS:   CONSTITUTIONAL: No fever, but fatigue, generalized weakness, and poor p.o. intake. No weight changes.  EYES: History of keratitis and corneal transplant. She has decreased vision.  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  CARDIOVASCULAR: No chest pain, orthopnea, or swelling in the legs. Has history of high blood pressure.  GASTROINTESTINAL: Positive for nausea and vomiting. No abdominal pain, melena or bright red blood per rectum.  GENITOURINARY: Denies dysuria or hematuria.  ENDOCRINE: No polyuria or nocturia.  HEMATOLOGY/LYMPHATICS: Has a history of polyclonal gammopathy. DERMATOLOGY: History of recurrent folliculitis and boils. NEUROLOGIC: Has chronic numbness and peripheral neuropathy below the knees bilaterally.  PSYCHIATRIC: Has depression.   PHYSICAL EXAMINATION:   VITAL SIGNS: Temperature 97.3, pulse 70, respiratory rate 20, blood pressure 107/69 and O2 saturation 96% on room air.  GENERAL: The patient is a morbidly obese, Caucasian female laying in bed in no obvious distress.  HEENT: Normocephalic, atraumatic. Pupils are equal. There is a film-like covering of the right cornea. Dry mucous membranes.  NECK: Supple. No thyroid tenderness. No cervical lymphadenopathy.  CARDIOVASCULAR: S1, S2 regular rate and rhythm. No murmurs, rubs, or gallops.  LUNGS: Good air entry bilaterally. No wheezing, rhonchi or rales.  ABDOMEN: Soft, nontender, nondistended. Positive bowel sounds in all quadrants. Examination of the flank shows no costophrenic angle tenderness.  EXTREMITIES: No significant lower extremity edema.  SKIN: There is about 2 to 3 inch swelling without any significant drainage of a subcutaneous mass on the right flank  area with surrounding redness and tenderness. There are multiple healed scars on the right flank as well, likely from previous boils.  NEUROLOGIC: Cranial nerves II through  XII grossly intact. Strength is 5 out of  5 in all extremities. Sensation is intact to light touch except for below the knees bilaterally, which subjectively the patient states has diminished sensation.  PSYCHIATRIC: Awake, alert, and oriented x 3. Cooperative.   LABORATORY DATA: No labs as of yet, but as an outpatient from this morning WBC was 15.6, hematocrit 41, platelets 458. Glucose 267, sodium 130, potassium 4.2, creatinine was 1.5, which appears to be higher than her baseline. LFTs from outpatient labs appear to be within normal limits.   ASSESSMENT AND PLAN: We have a 44 year old Caucasian female with hypertension, hyperlipidemia, recurrent boils with I and D of a boil a couple of days ago on Septra and getting dehydrated with persistent symptoms of leukocytosis and were are asked for direct admission.   In regards to the recurrent folliculitis and flank boils, there is no active drainage at this point, but the area is tender and the patient does have leukocytosis per outpatient labs. Would start the patient on vancomycin and gets labs including BNP and CBC as well as blood cultures and get a surgical consult. The patient does not appear to have a fever here.   In regards to her diabetes, would hold oral medications in the setting of acute renal failure and start sliding scale insulin and check a hemoglobin A1c.   In regards to hypertension, would hold the ACE inhibitor, but continue the beta blocker. Blood pressure appears to be stable at this point.   Acute renal failure. Outpatient labs show a creatinine of 1.5 and at this point it could be secondary to infection and poor p.o. intake, possibly medication contribution as well. We would hold all nephrotoxins at this point and start the patient on IV fluids, check a repeat BMP here and follow the creatinine. Would hold the Septra, which was started as an outpatient and get pharmacy to dose the vancomycin. This also likely is secondary to  dehydration, which should improve with IV fluids. Would continue the patient's anxiety medications. The patient does smoke tobacco and she was counseled for 3 minutes about smoking cessation. The patient does want a patch, which we would order at this point.   TOTAL TIME SPENT: 55 minutes.   ____________________________ Krystal Eaton, MD sa:aw D: 11/18/2012 14:58:14 ET T: 11/18/2012 15:22:50 ET JOB#: [id number]  cc: Krystal Eaton, MD, <Dictator> Rhona Leavens. Burnett Sheng, MD Krystal Eaton MD ELECTRONICALLY SIGNED 12/04/2012 20:05

## 2015-02-18 NOTE — Op Note (Signed)
PATIENT NAME:  Denise Bass, [name] MR#:  [id number] DATE OF BIRTH:  [birth year]  DATE OF PROCEDURE:  11/18/2012  PREOPERATIVE DIAGNOSIS: Right chest wall abscess.   POSTOPERATIVE DIAGNOSIS: Right chest wall abscess.  OPERATIVE PROCEDURE: Incision and drainage.   ANESTHESIA: Local.   SURGEON: Dr. Michela PitcherEly.  OPERATIVE PROCEDURE: With the patient in the supine position and after appropriate Betadine prep, the area was infiltrated with 0.25% Marcaine using approximately 8 mL. The area was then incised and about 10 mL of purulent material was removed. Sterile dressings were applied. The procedure was terminated. The patient tolerated it without difficulty.   ____________________________ Quentin Orealph L. Ely III, MD rle:aw D: 11/18/2012 23:00:46 ET T: 11/19/2012 07:21:34 ET JOB#: [id number]  cc: Quentin Orealph L. Ely III, MD, <Dictator> Ozella RocksJIM HENDRICK, MD Quentin OreALPH L ELY MD ELECTRONICALLY SIGNED 11/19/2012 22:10

## 2015-02-18 NOTE — Discharge Summary (Signed)
PATIENT NAME:  Denise Bass, Denise Bass MR#:  [id number] DATE OF BIRTH:  1971-07-16  DATE OF ADMISSION:  11/18/2012 DATE OF DISCHARGE:  11/19/2012  The patient signed AMA last night about 9:30 p.Bass.   PRIMARY CARE PHYSICIAN:  Dr. Burnett ShengHedrick.   CONSULTING PHYSICIAN:  Dr. Michela PitcherEly.   PROCEDURE:  Incision and drainage.    DISCHARGE DIAGNOSES: Boil, leukocytosis, diabetes, hypertension, dehydration, tobacco abuse.   REASON FOR ADMISSION: Persistent pain at the site of the right flank boil.   HOSPITAL COURSE: The patient is a 44 year old Caucasian female with multiple medical problems including morbid obesity, hypertension, diabetes, hyperlipidemia, recurrent folliculitis, presented to the ED with recurrent boil on the right side of the flank about 6 days. The patient  and was noted to have a leukocytosis of 15,000. Creatinine increased to 1.5, so hospitalist service requested for admission by PCP. For detailed history and physical examination, please refer to the admission note dictated by Dr. Jacques NavyAhmadzia. After admission, Dr. Michela PitcherEly did a bedside I and D; 10 mL of purulent material was removed, dressing was applied. The patient was placed on vancomycin IVPB. The patient's white count decreased to 9.5.   For diabetes, the patient has been treated with sliding scale. Hemoglobin A1c is 9.3.   For acute renal failure and dehydration, the patient was treated with IV fluids and creatinine decreased to a normal range. This patient complained of weakness and sickness. The patient was planned to be discharged the next day; however, the patient signed out AMA last night.    ____________________________ Shaune PollackQing Miosha Behe, MD qc:cc D: 11/20/2012 15:49:08 ET T: 11/20/2012 19:01:10 ET JOB#: [id number]  cc: Shaune PollackQing Gaige Sebo, MD, <Dictator> Shaune PollackQING Taya Ashbaugh MD ELECTRONICALLY SIGNED 11/22/2012 12:28

## 2015-02-19 NOTE — Discharge Summary (Signed)
PATIENT NAME:  Denise Bass, [name] MR#:  [id number] DATE OF BIRTH:  04-07-1971  DATE OF ADMISSION:  08/29/2014 DATE OF DISCHARGE:  08/31/2014  PRIMARY CARE PHYSICIAN:  Sandie AnoJames F Hedrick, MD   FINAL DIAGNOSES: 1.  Uncontrolled diarrhea.  2.  Hypokalemia and hypomagnesemia.  3.  Hypertension.  4.  Diabetes.  5.  Acute renal failure, improved with hydration, likely acute tubular necrosis.  6.  Neuropathy.   MEDICATIONS ON DISCHARGE: Include lisinopril 20 mg daily, clonazepam 1 mg twice a day, gabapentin 300 mg 3 capsules 3 times a day, trazodone 100 mg at bedtime, Januvia 100 mg daily, VESIcare 5 mg daily, cyclobenzaprine 5 mg once a day at bedtime; ranitidine 150 mg twice a day, glipizide XL 10 mg twice a day; aspirin 81 mg daily, Cymbalta 60 mg daily, metoprolol tartrate 25 mg twice a day, does decreased; metronidazole 500 mg every 8 hours for 6 days; Cipro 500 mg every 12 hours for 6 days; oxycodone 5 mg every 6 hours as needed for pain; stop taking furosemide.   Home health physical therapy, nurse, nurse aide.   TREATMENTS: None.   DIET: Low-sodium, carbohydrate-controlled diet, regular consistency.   FOLLOW-UP:  Sandie AnoJames F Hedrick, MD, in  1 to 2 weeks.   HOSPITAL COURSE: The patient was admitted 08/29/2014; discharged 08/31/2014; came in with uncontrolled diarrhea. Stool studies were sent off. Patient was started on oral Flagyl initially, potassium and magnesium replaced. The patient was given IV fluid hydration.   LABORATORY AND RADIOLOGICAL DATA DURING THE HOSPITAL COURSE: Included magnesium 1.5, beta-hCG less than 1, lipase 151, glucose 350, BUN 17, creatinine 1.99, sodium 129, potassium 2.6, chloride 84, CO2 of 27, calcium 9.6; liver function tests, alkaline phosphatase 161, ALT 31, AST 26, total protein 9.0, albumin 3.0, white blood cell count 20.0, hemoglobin and hematocrit of 14.5 and 45.0, platelet count 542,000; troponin negative, abdominal flat and upright and chest x-ray were negative;  urinalysis, trace leukocyte esterase. CT scan of the abdomen and pelvis showed no acute abnormality, mild hepato steatosis, cholecystectomy, benign pulmonary nodules; stool culture, colonies too small to read; stool for Clostridium difficile was negative; magnesium improved to 2.7, TSH slightly low at 1.23, free thyroxine 1.22; white blood cells in the stool none; creatinine upon discharge 1.33, potassium 3.8, magnesium 2.1.   HOSPITAL COURSE:  Per problem list:  1.  For the patient's uncontrollable diarrhea, unclear whether this is viral or bacterial. Stool cultures were unremarkable. I did prescribe empiric Cipro and Flagyl which will be continued to completion. The patient's diarrhea had slowed down. The patient was able tolerate food without an issue, stable for discharge.  2.  Hypokalemia and hypomagnesemia secondary to diarrhea and gastrointestinal losses. This was replaced intravenous and orally during the hospital course; since the patient is tolerating diet and not having any further diarrhea, no need for replacement upon discharge.  3.  Hypertension. Usual medications were continued, decreased dose of the metoprolol given.  4.  Diabetes. Usual glipizide continued.  5.  Acute renal failure, improved with intravenous hydration likely acute tubular necrosis from gastrointestinal losses.  6.  Neuropathy on gabapentin.   TIME SPENT ON DISCHARGE:  Was 35 minutes.    ____________________________ Herschell Dimesichard J. Renae GlossWieting, MD rjw:nt D: 08/31/2014 15:41:40 ET T: 09/01/2014 01:45:31 ET JOB#: [id number]  cc: Herschell Dimesichard J. Renae GlossWieting, MD, <Dictator> Rhona LeavensJames F. Burnett ShengHedrick, MD Salley ScarletICHARD J Grover Woodfield MD ELECTRONICALLY SIGNED 09/01/2014 11:01

## 2015-02-19 NOTE — H&P (Signed)
PATIENT NAME:  Denise Bass, Denise Bass MR#:  [id number] DATE OF BIRTH:  [birth year]  DATE OF ADMISSION:  08/29/2014  PRIMARY CARE PHYSICIAN:  Rhona LeavensJames F. Burnett ShengHedrick, MD   REFERRING EMERGENCY ROOM PHYSICIAN:  Cecille AmsterdamJonathan E. Chrissie NoaWilliam, MD  CHIEF COMPLAINT:  Diarrhea.   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 She was in the Emergency Room a month ago and was given Levaquin prescription for UTI, which she took. As per her, she has repeated infections in the urine. Now, for the last one week, she is having vomiting and diarrhea, which is frequent, and she is not able to eat enough. She also noticed some blood in the stool yesterday and so decided to come to the Emergency Room today. She has abdominal pain, which is diffuse, constant, cramping-type, and getting worse with episode of vomiting or diarrhea but denies any fever or chills.   REVIEW OF SYSTEMS: CONSTITUTIONAL:  Negative for fever or fatigue. Positive generalized weakness. No weight loss or weight gain.  EYES:  No blurring or double vision, discharge, or redness.  EARS, NOSE, THROAT:  No tinnitus, ear pain, or hearing loss.  RESPIRATORY:  No cough, wheezing, hemoptysis, or shortness of breath.  CARDIOVASCULAR:  No chest pain, orthopnea, edema, arrhythmia, or palpitations.  GASTROINTESTINAL:  The patient has nausea and vomiting and has diarrhea with some diffuse abdominal pain.  GENITOURINARY:  No dysuria, hematuria, or increased frequency.  ENDOCRINE:  No heat or cold intolerance.  SKIN:  No acne, rashes, or lesions.  MUSCULOSKELETAL:  No pain or swelling in the joints.  NEUROLOGICAL:  No numbness, weakness, tremor, or vertigo.  PSYCHIATRIC:  No anxiety, insomnia, or bipolar disorder.    PAST MEDICAL HISTORY: 1.  Hypertension.  2.  Hyperlipidemia.  3.  Diabetes.  4.  Corneal dystrophy.  5.  abnormal retina.  6.  Status post corneal transplant, legally blind.  7.  Osteoarthritis of knee.  8.  Chronic back pain.  9.  History of recurrent folliculitis and boils.  10.  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  11.  History of cholecystectomy and obesity.  12.  Miscarriages x 3.  13.  Ovarian cyst.  14.  History of D and C.   SOCIAL HISTORY:  She was a smoker of 2 packs of cigarettes every day, but currently cutting down now at a level of 1 to 2 cigarettes per day. No alcohol or drug use.   FAMILY HISTORY:  Mother had stroke, diabetes, and emphysema.   HOME MEDICATIONS: 1.  VESIcare 5 mg oral tablet once a day.  2.  Trazodone 100 mg oral tablet once a day.  3.  Ranitidine 150 mg orally 2 times a day.  4.  Metoprolol 50 mg oral tablet 2 times a day.  5.  Lisinopril 20 mg orally once a day.  6.  Januvia 100 mg oral tablet once a day.  7.  Glipizide XL 10 mg orally 2 times a day.  8.  Gabapentin 300 mg 3 capsules 3 times a day.  9.  Furosemide 20 mg orally once a day.  10.  Fluoxetine 60 mg orally once a day.  11.  Cyclobenzaprine 5 mg orally once a day.  12.  Clonazepam 1 mg orally 2 times a day.  13.  Aspirin 81 mg once a day.   PHYSICAL EXAMINATION: VITAL SIGNS:  In ER, temperature 98.4, pulse 132, respirations 22, blood pressure 112/64, and pulse oximetry is 100% on room air.  GENERAL:  The patient is alert, has some hearing deficit, but she is oriented to time, place, and person.  HEAD AND NECK:  Atraumatic. Conjunctivae are pink. Cornea is opacified. Oral mucosa is dry. Neck is supple. No thyroid tenderness.  RESPIRATORY:  Bilateral equal air entry. No crepitation or wheezing.  CARDIOVASCULAR:  S1 and S2 present, regular. No murmur.  ABDOMEN:  Mild tenderness.  Bowel sounds present and normal. No organomegaly.  SKIN:  No rashes.  LEGS:  No edema.  NEUROLOGICAL:  Power is 5/5. She follows commands in all 4 limbs. No tremor or rigidity.  PSYCHIATRIC:  No anxiety or insomnia. She does not appear to have any acute illness at this time.  JOINTS:  No swelling or tenderness.  NEUROLOGICAL:  No numbness or weakness. No tremor or rigidity.   IMPORTANT LABORATORY RESULTS:   1.  Glucose is 350, BUN 17, creatinine 1.99, sodium 129, potassium 2.6, chloride 84, and CO2 is 27.  2.  Magnesium is 1.5, lipase 151, and beta-hCG is less than 1.  3.  Total protein is 9, albumin 3, bilirubin 1, alkaline phosphatase 161, SGOT 26, and SGPT is 31.  4.  Troponin is less than 0.02.  5.  WBC is 20,000, hemoglobin 14.5, platelet count 542,000, and MCV is 76.  6.  Urinalysis is grossly with cloudy urine and 272 WBCs and trace leukocyte esterase.   IMAGING:   1.  CT scan of the abdomen and pelvis was done. No acute abnormality. Mild hepatic steatosis,  cholecystectomy, and benign pulmonary nodule.  2.  Abdominal x-ray was done. No acute abnormality in the chest or abdomen is noticed.   ASSESSMENT AND PLAN:  This is a 44 year old female with a past medical history of hypertension, hyperlipidemia, diabetes, and some kind of metabolic or storage disorder that is under investigation, who had urinary tract infection last month and received Levaquin, comes now with diarrhea and vomiting for one week and feeling weak and found having electrolyte abnormalities and dehydration.   1.  Diarrhea. Most likely, this is secondary to Clostridium difficile infection, as the patient has poor immunity and some kind of metabolic disorder, and she had used Levaquin last month. We will keep her on contact isolation as a precaution and give oral Flagyl at this time.  2.  Dehydration and acute renal failure. Creatinine is 1.99. We will give IV fluid and hydrate her and continue monitoring.  3.   Hypokalemia and hypomagnesemia. This is a result of continuous nausea and vomiting and poor hydration. We will replace it and recheck tomorrow.  4.  Diabetes. We will give insulin sliding scale coverage at this time.  5.  For blood pressure, currently we will be giving IV fluids, so we will cut down the hypertensive medications at this time.  6.  Smoking. Smoking cessation counseling was done for 4 minutes, and she was offered a nicotine patch. She agreed to use that.   TOTAL TIME SPENT ON THIS ADMISSION:  50 minutes.    ____________________________ Hope Pigeon Elisabeth Pigeon, MD vgv:nb D: 08/29/2014 21:37:00 ET T: 08/29/2014 22:02:18 ET JOB#: [id number]  cc: Rhona Leavens. Burnett Sheng, MD Hope Pigeon Elisabeth Pigeon, MD, <Dictator>     Altamese Dilling MD ELECTRONICALLY SIGNED  09/08/2014 22:18 

## 2015-04-28 ENCOUNTER — Other Ambulatory Visit: Payer: Self-pay | Admitting: Neurosurgery

## 2015-04-28 DIAGNOSIS — M5136 Other intervertebral disc degeneration, lumbar region: Secondary | ICD-10-CM

## 2015-05-16 ENCOUNTER — Other Ambulatory Visit: Payer: Medicare Other

## 2015-06-01 ENCOUNTER — Ambulatory Visit: Payer: Medicare Other

## 2015-06-13 ENCOUNTER — Inpatient Hospital Stay
Admission: EM | Admit: 2015-06-13 | Discharge: 2015-06-14 | DRG: 683 | Discharge status: Against Medical Advice | Payer: Medicare Other | Attending: Internal Medicine | Admitting: Internal Medicine

## 2015-06-13 DIAGNOSIS — N189 Chronic kidney disease, unspecified: Secondary | ICD-10-CM

## 2015-06-13 DIAGNOSIS — Z823 Family history of stroke: Secondary | ICD-10-CM

## 2015-06-13 DIAGNOSIS — N183 Chronic kidney disease, stage 3 (moderate): Secondary | ICD-10-CM | POA: Diagnosis present

## 2015-06-13 DIAGNOSIS — Z66 Do not resuscitate: Secondary | ICD-10-CM | POA: Diagnosis present

## 2015-06-13 DIAGNOSIS — R739 Hyperglycemia, unspecified: Secondary | ICD-10-CM

## 2015-06-13 DIAGNOSIS — E1142 Type 2 diabetes mellitus with diabetic polyneuropathy: Secondary | ICD-10-CM | POA: Diagnosis present

## 2015-06-13 DIAGNOSIS — F600 Paranoid personality disorder: Secondary | ICD-10-CM | POA: Diagnosis present

## 2015-06-13 DIAGNOSIS — F17210 Nicotine dependence, cigarettes, uncomplicated: Secondary | ICD-10-CM | POA: Diagnosis present

## 2015-06-13 DIAGNOSIS — Z8379 Family history of other diseases of the digestive system: Secondary | ICD-10-CM

## 2015-06-13 DIAGNOSIS — E871 Hypo-osmolality and hyponatremia: Secondary | ICD-10-CM | POA: Diagnosis present

## 2015-06-13 DIAGNOSIS — Z9049 Acquired absence of other specified parts of digestive tract: Secondary | ICD-10-CM | POA: Diagnosis present

## 2015-06-13 DIAGNOSIS — K529 Noninfective gastroenteritis and colitis, unspecified: Secondary | ICD-10-CM | POA: Diagnosis present

## 2015-06-13 DIAGNOSIS — N179 Acute kidney failure, unspecified: Secondary | ICD-10-CM | POA: Diagnosis not present

## 2015-06-13 DIAGNOSIS — Z7982 Long term (current) use of aspirin: Secondary | ICD-10-CM

## 2015-06-13 DIAGNOSIS — K219 Gastro-esophageal reflux disease without esophagitis: Secondary | ICD-10-CM | POA: Diagnosis present

## 2015-06-13 DIAGNOSIS — E860 Dehydration: Secondary | ICD-10-CM | POA: Diagnosis present

## 2015-06-13 DIAGNOSIS — Z91040 Latex allergy status: Secondary | ICD-10-CM

## 2015-06-13 DIAGNOSIS — Z825 Family history of asthma and other chronic lower respiratory diseases: Secondary | ICD-10-CM

## 2015-06-13 DIAGNOSIS — Z947 Corneal transplant status: Secondary | ICD-10-CM

## 2015-06-13 DIAGNOSIS — Z8673 Personal history of transient ischemic attack (TIA), and cerebral infarction without residual deficits: Secondary | ICD-10-CM

## 2015-06-13 DIAGNOSIS — G629 Polyneuropathy, unspecified: Secondary | ICD-10-CM | POA: Diagnosis present

## 2015-06-13 DIAGNOSIS — F329 Major depressive disorder, single episode, unspecified: Secondary | ICD-10-CM | POA: Diagnosis present

## 2015-06-13 DIAGNOSIS — E1165 Type 2 diabetes mellitus with hyperglycemia: Secondary | ICD-10-CM | POA: Diagnosis present

## 2015-06-13 DIAGNOSIS — Z880 Allergy status to penicillin: Secondary | ICD-10-CM

## 2015-06-13 DIAGNOSIS — D72829 Elevated white blood cell count, unspecified: Secondary | ICD-10-CM | POA: Diagnosis present

## 2015-06-13 DIAGNOSIS — Z6838 Body mass index (BMI) 38.0-38.9, adult: Secondary | ICD-10-CM

## 2015-06-13 DIAGNOSIS — M170 Bilateral primary osteoarthritis of knee: Secondary | ICD-10-CM | POA: Diagnosis present

## 2015-06-13 DIAGNOSIS — E1122 Type 2 diabetes mellitus with diabetic chronic kidney disease: Secondary | ICD-10-CM | POA: Diagnosis present

## 2015-06-13 DIAGNOSIS — J449 Chronic obstructive pulmonary disease, unspecified: Secondary | ICD-10-CM | POA: Diagnosis present

## 2015-06-13 DIAGNOSIS — I129 Hypertensive chronic kidney disease with stage 1 through stage 4 chronic kidney disease, or unspecified chronic kidney disease: Secondary | ICD-10-CM | POA: Diagnosis present

## 2015-06-13 LAB — CBC WITH DIFFERENTIAL/PLATELET
BASOS PCT: 1 %
Basophils Absolute: 0.1 10*3/uL (ref 0–0.1)
EOS ABS: 0.1 10*3/uL (ref 0–0.7)
Eosinophils Relative: 1 %
HEMATOCRIT: 44.9 % (ref 35.0–47.0)
Hemoglobin: 14.5 g/dL (ref 12.0–16.0)
Lymphocytes Relative: 11 %
Lymphs Abs: 2.2 10*3/uL (ref 1.0–3.6)
MCH: 23.2 pg — ABNORMAL LOW (ref 26.0–34.0)
MCHC: 32.2 g/dL (ref 32.0–36.0)
MCV: 72 fL — ABNORMAL LOW (ref 80.0–100.0)
Monocytes Absolute: 1.4 10*3/uL — ABNORMAL HIGH (ref 0.2–0.9)
Monocytes Relative: 7 %
NEUTROS ABS: 17 10*3/uL — AB (ref 1.4–6.5)
NEUTROS PCT: 80 %
Platelets: 592 10*3/uL — ABNORMAL HIGH (ref 150–440)
RBC: 6.24 MIL/uL — ABNORMAL HIGH (ref 3.80–5.20)
RDW: 17.6 % — AB (ref 11.5–14.5)
WBC: 21 10*3/uL — ABNORMAL HIGH (ref 3.6–11.0)

## 2015-06-13 LAB — BLOOD GAS, VENOUS
Acid-Base Excess: 1.3 mmol/L (ref 0.0–3.0)
Bicarbonate: 25 mEq/L (ref 21.0–28.0)
PATIENT TEMPERATURE: 37
PCO2 VEN: 36 mmHg — AB (ref 44.0–60.0)
pH, Ven: 7.45 — ABNORMAL HIGH (ref 7.320–7.430)

## 2015-06-13 LAB — URINALYSIS COMPLETE WITH MICROSCOPIC (ARMC ONLY)
BILIRUBIN URINE: NEGATIVE
Bacteria, UA: NONE SEEN
Glucose, UA: >500 mg/dL — AB
KETONES UR: NEGATIVE mg/dL
LEUKOCYTES UA: NEGATIVE
Nitrite: NEGATIVE
PH: 6 (ref 5.0–8.0)
PROTEIN: 30 mg/dL — AB
RBC / HPF: NONE SEEN RBC/hpf (ref 0–5)
SPECIFIC GRAVITY, URINE: 1.01 (ref 1.005–1.030)

## 2015-06-13 LAB — COMPREHENSIVE METABOLIC PANEL
ALBUMIN: 3.5 g/dL (ref 3.5–5.0)
ALK PHOS: 129 U/L — AB (ref 38–126)
ALT: 23 U/L (ref 14–54)
AST: 29 U/L (ref 15–41)
Anion gap: 16 — ABNORMAL HIGH (ref 5–15)
BUN: 46 mg/dL — AB (ref 6–20)
CO2: 23 mmol/L (ref 22–32)
Calcium: 10 mg/dL (ref 8.9–10.3)
Chloride: 85 mmol/L — ABNORMAL LOW (ref 101–111)
Creatinine, Ser: 3.11 mg/dL — ABNORMAL HIGH (ref 0.44–1.00)
GFR calc Af Amer: 20 mL/min — ABNORMAL LOW (ref >60)
GFR calc non Af Amer: 17 mL/min — ABNORMAL LOW (ref >60)
GLUCOSE: 454 mg/dL — AB (ref 65–99)
POTASSIUM: 3.9 mmol/L (ref 3.5–5.1)
SODIUM: 124 mmol/L — AB (ref 135–145)
TOTAL PROTEIN: 9.4 g/dL — AB (ref 6.5–8.1)
Total Bilirubin: 0.9 mg/dL (ref 0.3–1.2)

## 2015-06-13 LAB — LIPASE, BLOOD: Lipase: 36 U/L (ref 22–51)

## 2015-06-13 LAB — GLUCOSE, CAPILLARY: GLUCOSE-CAPILLARY: 395 mg/dL — AB (ref 65–99)

## 2015-06-13 MED ORDER — SODIUM CHLORIDE 0.9 % IV BOLUS (SEPSIS)
1000.0000 mL | Freq: Once | INTRAVENOUS | Status: AC
  Administered 2015-06-13: 1000 mL via INTRAVENOUS

## 2015-06-13 NOTE — ED Notes (Signed)
Pt to triage via w/c with no distress noted; pt reports N/V/D x 3 days with abd pain

## 2015-06-13 NOTE — ED Provider Notes (Signed)
[facility] Emergency Department Provider Note   ____________________________________________  Time seen: 2050  I have reviewed the triage vital signs and the nursing notes.   HISTORY  Chief Complaint Abdominal Pain and Emesis   History limited by: Not Limited   HPI Denise Bass is a 44 y.o. female who presents to the emergency department with nausea, vomiting and diarrhea for 3 days. Patient states that the symptoms have been constant. She also describes associated diffuse abdominal pain. She denies any blood in her emesis or diarrhea. She denies any fevers. She states she has had similar symptoms in the past the last time this happened her potassium is very low.   Past Medical History  Diagnosis Date  . Diabetes mellitus without complication   . Anxiety   . Arthritis   . COPD (chronic obstructive pulmonary disease)   . Emphysema of lung   . GERD (gastroesophageal reflux disease)   . Hypertension   . 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 Neuromuscular disorder   . Osteoarthritis of both knees   . AC (acromioclavicular) joint bone spurs     knees  . Stroke   . Substance abuse   . Genetic blood cell disorder     Rare/unknown/still researching at [place]/    Patient Active Problem List   Diagnosis Date Noted  . COPD (chronic obstructive pulmonary disease) 03/05/2013  . Diabetes mellitus 03/05/2013  . Nephropathy 03/05/2013  . Diabetic neuropathy 03/05/2013  . CVA (cerebral infarction) 03/05/2013  . Essential hypertension, benign 03/05/2013  . Anxiety 03/05/2013  . Arthritis 03/05/2013  . GERD (gastroesophageal reflux disease) 03/05/2013  . 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 Tobacco use disorder, continuous 03/05/2013  . Marijuana smoker 03/05/2013  . Menorrhagia 03/05/2013  . Obesity, Class III, BMI 40-49.9 (morbid obesity) 03/05/2013  .  History of PCOS 03/05/2013  . Dysmenorrhea 03/05/2013    Past Surgical History  Procedure Laterality Date  . Cholecystectomy    . Dilation and curettage of uterus    . Tonsilectomy, adenoidectomy, bilateral myringotomy and tubes    . Corneal transplant      Current Outpatient Rx  Name  Route  Sig  Dispense  Refill  . clonazePAM (KLONOPIN) 1 MG tablet               . DULoxetine (CYMBALTA) 60 MG capsule               . furosemide (LASIX) 20 MG tablet               . gabapentin (NEURONTIN) 300 MG capsule               . glipiZIDE (GLUCOTROL XL) 10 MG 24 hr tablet               . HYDROcodone-acetaminophen (NORCO) 7.5-325 MG per tablet               . lisinopril (PRINIVIL,ZESTRIL) 20 MG tablet               . metoprolol succinate (TOPROL-XL) 50 MG 24 hr tablet               . traMADol (ULTRAM) 50 MG tablet               . traZODone (DESYREL) 100 MG tablet  Allergies Penicillins and Vicodin  Family History  Problem Relation Age of Onset  . Stroke Mother   . COPD Mother   . Emphysema Mother   . Asthma Mother   . Liver disease Father   . Stroke Father     cerebral hemrhage    Social History Social History  Substance Use Topics  . Smoking status: Current Every Day Smoker -- 2.00 packs/day for 29 years  . Smokeless tobacco: Never Used     Comment: usine nicotine patches  . Alcohol Use: No    Review of Systems  Constitutional: Negative for fever. Cardiovascular: Negative for chest pain. Respiratory: Negative for shortness of breath. Gastrointestinal: Positive for abdominal pain, vomiting and diarrhea. Genitourinary: Negative for dysuria. Musculoskeletal: Negative for back pain. Skin: Negative for rash. Neurological: Negative for headaches, focal weakness or numbness.   10-point ROS otherwise negative.  ____________________________________________   PHYSICAL EXAM:  VITAL SIGNS: ED Triage Vitals   Enc Vitals Group     BP 06/13/15 2034 59/33 mmHg     Pulse Rate 06/13/15 2034 110     Resp 06/13/15 2034 18     Temp 06/13/15 2034 98 F (36.7 C)     Temp Source 06/13/15 2034 Oral     SpO2 06/13/15 2034 97 %     Weight 06/13/15 2034 230 lb (104.327 kg)     Height 06/13/15 2034 5\' 5"  (1.651 m)     Head Cir --      Peak Flow --      Pain Score 06/13/15 2032 10   Constitutional: Alert and oriented. Well appearing and in no distress. Eyes: Left eye with corneal haziness ENT   Head: Normocephalic and atraumatic.   Nose: No congestion/rhinnorhea.   Mouth/Throat: Mucous membranes are moist.   Neck: No stridor. Hematological/Lymphatic/Immunilogical: No cervical lymphadenopathy. Cardiovascular: Normal rate, regular rhythm.  No murmurs, rubs, or gallops. Respiratory: Normal respiratory effort without tachypnea nor retractions. Breath sounds are clear and equal bilaterally. No wheezes/rales/rhonchi. Gastrointestinal: Soft and minimally tender to palpation diffusely. Genitourinary: Deferred Musculoskeletal: Normal range of motion in all extremities. No joint effusions.  No lower extremity tenderness nor edema. Neurologic:  Normal speech and language. No gross focal neurologic deficits are appreciated. Speech is normal.  Skin:  Skin is warm, dry and intact. No rash noted. Psychiatric: Mood and affect are normal. Speech and behavior are normal. Patient exhibits appropriate insight and judgment.  ____________________________________________    LABS (pertinent positives/negatives)  Labs Reviewed  CBC WITH DIFFERENTIAL/PLATELET - Abnormal; Notable for the following:    WBC 21.0 (*)    RBC 6.24 (*)    MCV 72.0 (*)    MCH 23.2 (*)    RDW 17.6 (*)    Platelets 592 (*)    Neutro Abs 17.0 (*)    Monocytes Absolute 1.4 (*)    All other components within normal limits  COMPREHENSIVE METABOLIC PANEL - Abnormal; Notable for the following:    Sodium 124 (*)    Chloride 85 (*)     Glucose, Bld 454 (*)    BUN 46 (*)    Creatinine, Ser 3.11 (*)    Total Protein 9.4 (*)    Alkaline Phosphatase 129 (*)    GFR calc non Af Amer 17 (*)    GFR calc Af Amer 20 (*)    Anion gap 16 (*)    All other components within normal limits  URINALYSIS COMPLETEWITH MICROSCOPIC ([facility] ONLY) - Abnormal; Notable for the following:  Color, Urine YELLOW (*)    APPearance CLEAR (*)    Glucose, UA >500 (*)    Hgb urine dipstick 1+ (*)    Protein, ur 30 (*)    Squamous Epithelial / LPF 0-5 (*)    All other components within normal limits  BLOOD GAS, VENOUS - Abnormal; Notable for the following:    pH, Ven 7.45 (*)    pCO2, Ven 36 (*)    All other components within normal limits  GLUCOSE, CAPILLARY - Abnormal; Notable for the following:    Glucose-Capillary 395 (*)    All other components within normal limits  LIPASE, BLOOD  LACTIC ACID, PLASMA  LACTIC ACID, PLASMA       ____________________________________________    RADIOLOGY  None  ____________________________________________   PROCEDURES  Procedure(s) performed: None  Critical Care performed: No  ____________________________________________   INITIAL IMPRESSION / ASSESSMENT AND PLAN / ED COURSE  Pertinent labs & imaging results that were available during my care of the patient were reviewed by me and considered in my medical decision making (see chart for details).  Patient presented to the emergency department today with concerns for 3 days of nausea vomiting and diarrhea as well as abdominal pain. On exam patient. Minimally tender to palpation throughout her whole abdomen. It is soft. No focal area of tenderness. At this point I do not see an indication for emergent imaging I doubt appendicitis, cholecystitis, diverticulitis or other intra-abdominal pathology. I think likely the patient's symptoms are related more to dehydration and hyperglycemia. Patient's creatinine was elevated. Patient did have a mildly  elevated anion gap however no ketones in her urine nor did she have an acidosis. Thus I doubt DKA. At this point I will plan on aggressive IV hydration and admission for further workup and management.  ____________________________________________   FINAL CLINICAL IMPRESSION(S) / ED DIAGNOSES  Final diagnoses:  Hyperglycemia  AKI (acute kidney injury)     Phineas Semen, MD 06/13/15 2354

## 2015-06-14 ENCOUNTER — Encounter: Payer: Self-pay | Admitting: *Deleted

## 2015-06-14 DIAGNOSIS — Z947 Corneal transplant status: Secondary | ICD-10-CM | POA: Diagnosis not present

## 2015-06-14 DIAGNOSIS — K529 Noninfective gastroenteritis and colitis, unspecified: Secondary | ICD-10-CM | POA: Diagnosis present

## 2015-06-14 DIAGNOSIS — Z825 Family history of asthma and other chronic lower respiratory diseases: Secondary | ICD-10-CM | POA: Diagnosis not present

## 2015-06-14 DIAGNOSIS — Z880 Allergy status to penicillin: Secondary | ICD-10-CM | POA: Diagnosis not present

## 2015-06-14 DIAGNOSIS — N183 Chronic kidney disease, stage 3 (moderate): Secondary | ICD-10-CM | POA: Diagnosis present

## 2015-06-14 DIAGNOSIS — K219 Gastro-esophageal reflux disease without esophagitis: Secondary | ICD-10-CM | POA: Diagnosis present

## 2015-06-14 DIAGNOSIS — F17210 Nicotine dependence, cigarettes, uncomplicated: Secondary | ICD-10-CM | POA: Diagnosis present

## 2015-06-14 DIAGNOSIS — N189 Chronic kidney disease, unspecified: Secondary | ICD-10-CM

## 2015-06-14 DIAGNOSIS — Z9049 Acquired absence of other specified parts of digestive tract: Secondary | ICD-10-CM | POA: Diagnosis present

## 2015-06-14 DIAGNOSIS — Z7982 Long term (current) use of aspirin: Secondary | ICD-10-CM | POA: Diagnosis not present

## 2015-06-14 DIAGNOSIS — Z8379 Family history of other diseases of the digestive system: Secondary | ICD-10-CM | POA: Diagnosis not present

## 2015-06-14 DIAGNOSIS — Z8673 Personal history of transient ischemic attack (TIA), and cerebral infarction without residual deficits: Secondary | ICD-10-CM | POA: Diagnosis not present

## 2015-06-14 DIAGNOSIS — J449 Chronic obstructive pulmonary disease, unspecified: Secondary | ICD-10-CM | POA: Diagnosis present

## 2015-06-14 DIAGNOSIS — Z6838 Body mass index (BMI) 38.0-38.9, adult: Secondary | ICD-10-CM | POA: Diagnosis not present

## 2015-06-14 DIAGNOSIS — F600 Paranoid personality disorder: Secondary | ICD-10-CM | POA: Diagnosis present

## 2015-06-14 DIAGNOSIS — D72829 Elevated white blood cell count, unspecified: Secondary | ICD-10-CM | POA: Diagnosis present

## 2015-06-14 DIAGNOSIS — N179 Acute kidney failure, unspecified: Secondary | ICD-10-CM | POA: Diagnosis present

## 2015-06-14 DIAGNOSIS — Z823 Family history of stroke: Secondary | ICD-10-CM | POA: Diagnosis not present

## 2015-06-14 DIAGNOSIS — I129 Hypertensive chronic kidney disease with stage 1 through stage 4 chronic kidney disease, or unspecified chronic kidney disease: Secondary | ICD-10-CM | POA: Diagnosis present

## 2015-06-14 DIAGNOSIS — G629 Polyneuropathy, unspecified: Secondary | ICD-10-CM | POA: Diagnosis present

## 2015-06-14 DIAGNOSIS — M170 Bilateral primary osteoarthritis of knee: Secondary | ICD-10-CM | POA: Diagnosis present

## 2015-06-14 DIAGNOSIS — E1122 Type 2 diabetes mellitus with diabetic chronic kidney disease: Secondary | ICD-10-CM | POA: Diagnosis present

## 2015-06-14 DIAGNOSIS — Z91040 Latex allergy status: Secondary | ICD-10-CM | POA: Diagnosis not present

## 2015-06-14 DIAGNOSIS — E1165 Type 2 diabetes mellitus with hyperglycemia: Secondary | ICD-10-CM | POA: Diagnosis present

## 2015-06-14 DIAGNOSIS — E1142 Type 2 diabetes mellitus with diabetic polyneuropathy: Secondary | ICD-10-CM | POA: Diagnosis present

## 2015-06-14 DIAGNOSIS — F329 Major depressive disorder, single episode, unspecified: Secondary | ICD-10-CM | POA: Diagnosis present

## 2015-06-14 DIAGNOSIS — E871 Hypo-osmolality and hyponatremia: Secondary | ICD-10-CM | POA: Diagnosis present

## 2015-06-14 DIAGNOSIS — Z66 Do not resuscitate: Secondary | ICD-10-CM | POA: Diagnosis present

## 2015-06-14 DIAGNOSIS — E860 Dehydration: Secondary | ICD-10-CM | POA: Diagnosis present

## 2015-06-14 LAB — BASIC METABOLIC PANEL
Anion gap: 12 (ref 5–15)
BUN: 39 mg/dL — AB (ref 6–20)
CALCIUM: 9.5 mg/dL (ref 8.9–10.3)
CO2: 28 mmol/L (ref 22–32)
CREATININE: 2.35 mg/dL — AB (ref 0.44–1.00)
Chloride: 94 mmol/L — ABNORMAL LOW (ref 101–111)
GFR calc non Af Amer: 25 mL/min — ABNORMAL LOW (ref >60)
GFR, EST AFRICAN AMERICAN: 28 mL/min — AB (ref >60)
GLUCOSE: 240 mg/dL — AB (ref 65–99)
Potassium: 3.7 mmol/L (ref 3.5–5.1)
Sodium: 134 mmol/L — ABNORMAL LOW (ref 135–145)

## 2015-06-14 LAB — GLUCOSE, CAPILLARY
Glucose-Capillary: 337 mg/dL — ABNORMAL HIGH (ref 65–99)
Glucose-Capillary: 242 mg/dL — ABNORMAL HIGH (ref 65–99)
Glucose-Capillary: 273 mg/dL — ABNORMAL HIGH (ref 65–99)
Glucose-Capillary: 230 mg/dL — ABNORMAL HIGH (ref 65–99)

## 2015-06-14 LAB — CBC
HCT: 40.5 % (ref 35.0–47.0)
Hemoglobin: 12.9 g/dL (ref 12.0–16.0)
MCH: 23 pg — AB (ref 26.0–34.0)
MCHC: 31.7 g/dL — AB (ref 32.0–36.0)
MCV: 72.6 fL — ABNORMAL LOW (ref 80.0–100.0)
PLATELETS: 472 10*3/uL — AB (ref 150–440)
RBC: 5.58 MIL/uL — ABNORMAL HIGH (ref 3.80–5.20)
RDW: 17.2 % — ABNORMAL HIGH (ref 11.5–14.5)
WBC: 15.1 10*3/uL — ABNORMAL HIGH (ref 3.6–11.0)

## 2015-06-14 LAB — TROPONIN I: Troponin I: <0.03 ng/mL (ref <0.031)

## 2015-06-14 LAB — TSH: TSH: 0.34 u[IU]/mL — ABNORMAL LOW (ref 0.350–4.500)

## 2015-06-14 LAB — LACTIC ACID, PLASMA: LACTIC ACID, VENOUS: 1.5 mmol/L (ref 0.5–2.0)

## 2015-06-14 MED ORDER — ALBUTEROL SULFATE (2.5 MG/3ML) 0.083% IN NEBU: 3.0000 mL | INHALATION_SOLUTION | Freq: Four times a day (QID) | RESPIRATORY_TRACT | Status: DC | PRN

## 2015-06-14 MED ORDER — DOCUSATE SODIUM 100 MG PO CAPS
100.0000 mg | ORAL_CAPSULE | Freq: Two times a day (BID) | ORAL | Status: DC
Start: 2015-06-14 — End: 2015-06-15
  Administered 2015-06-14: 100 mg via ORAL
  Filled 2015-06-14 (×2): qty 1

## 2015-06-14 MED ORDER — INSULIN ASPART 100 UNIT/ML ~~LOC~~ SOLN
0.0000 [IU] | Freq: Three times a day (TID) | SUBCUTANEOUS | Status: DC
  Administered 2015-06-14 (×2): 5 [IU] via SUBCUTANEOUS
  Administered 2015-06-14: 8 [IU] via SUBCUTANEOUS
  Filled 2015-06-14 (×2): qty 5
  Filled 2015-06-14 (×2): qty 8

## 2015-06-14 MED ORDER — SODIUM CHLORIDE 0.9 % IV SOLN
INTRAVENOUS | Status: DC
Start: 2015-06-14 — End: 2015-06-15
  Administered 2015-06-14 (×3): via INTRAVENOUS

## 2015-06-14 MED ORDER — INSULIN ASPART 100 UNIT/ML ~~LOC~~ SOLN
SUBCUTANEOUS | Status: AC
  Administered 2015-06-14: 4 [IU]
  Filled 2015-06-14: qty 4

## 2015-06-14 MED ORDER — INSULIN ASPART 100 UNIT/ML ~~LOC~~ SOLN: 0.0000 [IU] | Freq: Every day | SUBCUTANEOUS | Status: DC

## 2015-06-14 MED ORDER — GABAPENTIN 300 MG PO CAPS
300.0000 mg | ORAL_CAPSULE | Freq: Three times a day (TID) | ORAL | Status: DC
  Administered 2015-06-14 (×2): 300 mg via ORAL
  Filled 2015-06-14 (×2): qty 1

## 2015-06-14 MED ORDER — CLONAZEPAM 1 MG PO TABS
1.0000 mg | ORAL_TABLET | Freq: Two times a day (BID) | ORAL | Status: DC
  Administered 2015-06-14 (×2): 1 mg via ORAL
  Filled 2015-06-14 (×2): qty 1

## 2015-06-14 MED ORDER — TRAZODONE HCL 100 MG PO TABS: 100.0000 mg | ORAL_TABLET | Freq: Every day | ORAL | Status: DC

## 2015-06-14 MED ORDER — ONDANSETRON HCL 4 MG PO TABS: 4.0000 mg | ORAL_TABLET | Freq: Four times a day (QID) | ORAL | Status: DC | PRN

## 2015-06-14 MED ORDER — SODIUM CHLORIDE 0.9 % IJ SOLN
3.0000 mL | Freq: Two times a day (BID) | INTRAMUSCULAR | Status: DC
  Administered 2015-06-14: 3 mL via INTRAVENOUS

## 2015-06-14 MED ORDER — METOPROLOL SUCCINATE ER 50 MG PO TB24
50.0000 mg | ORAL_TABLET | Freq: Every day | ORAL | Status: DC
  Administered 2015-06-14: 50 mg via ORAL
  Filled 2015-06-14: qty 1

## 2015-06-14 MED ORDER — ACETAMINOPHEN 325 MG PO TABS: 650.0000 mg | ORAL_TABLET | Freq: Four times a day (QID) | ORAL | Status: DC | PRN

## 2015-06-14 MED ORDER — ONDANSETRON HCL 4 MG/2ML IJ SOLN
4.0000 mg | Freq: Four times a day (QID) | INTRAMUSCULAR | Status: DC | PRN
  Administered 2015-06-14: 4 mg via INTRAVENOUS
  Filled 2015-06-14: qty 2

## 2015-06-14 MED ORDER — TRAMADOL HCL 50 MG PO TABS
50.0000 mg | ORAL_TABLET | Freq: Two times a day (BID) | ORAL | Status: DC
  Administered 2015-06-14 (×2): 50 mg via ORAL
  Filled 2015-06-14 (×2): qty 1

## 2015-06-14 MED ORDER — DULOXETINE HCL 60 MG PO CPEP
60.0000 mg | ORAL_CAPSULE | Freq: Every day | ORAL | Status: DC
  Administered 2015-06-14: 60 mg via ORAL
  Filled 2015-06-14: qty 1

## 2015-06-14 MED ORDER — FUROSEMIDE 20 MG PO TABS
20.0000 mg | ORAL_TABLET | Freq: Every day | ORAL | Status: DC
  Filled 2015-06-14: qty 1

## 2015-06-14 MED ORDER — SIMVASTATIN 20 MG PO TABS: 10.0000 mg | ORAL_TABLET | Freq: Every day | ORAL | Status: DC

## 2015-06-14 MED ORDER — DARIFENACIN HYDROBROMIDE ER 7.5 MG PO TB24: 7.5000 mg | ORAL_TABLET | Freq: Every day | ORAL | Status: DC

## 2015-06-14 MED ORDER — LISINOPRIL 20 MG PO TABS
20.0000 mg | ORAL_TABLET | Freq: Every day | ORAL | Status: DC
  Administered 2015-06-14: 20 mg via ORAL
  Filled 2015-06-14: qty 1

## 2015-06-14 MED ORDER — HEPARIN SODIUM (PORCINE) 5000 UNIT/ML IJ SOLN
5000.0000 [IU] | Freq: Three times a day (TID) | INTRAMUSCULAR | Status: DC
Start: 2015-06-14 — End: 2015-06-15
  Administered 2015-06-14 (×2): 5000 [IU] via SUBCUTANEOUS
  Filled 2015-06-14 (×2): qty 1

## 2015-06-14 MED ORDER — RISPERIDONE 0.5 MG PO TBDP: 1.0000 mg | ORAL_TABLET | Freq: Two times a day (BID) | ORAL | Status: DC

## 2015-06-14 MED ORDER — ASPIRIN EC 81 MG PO TBEC
81.0000 mg | DELAYED_RELEASE_TABLET | Freq: Every day | ORAL | Status: DC
  Administered 2015-06-14: 81 mg via ORAL
  Filled 2015-06-14: qty 1

## 2015-06-14 MED ORDER — ACETAMINOPHEN 650 MG RE SUPP: 650.0000 mg | Freq: Four times a day (QID) | RECTAL | Status: DC | PRN

## 2015-06-14 MED ORDER — MORPHINE SULFATE (PF) 2 MG/ML IV SOLN: 1.0000 mg | INTRAVENOUS | Status: DC | PRN

## 2015-06-14 MED ORDER — NICOTINE 21 MG/24HR TD PT24
21.0000 mg | MEDICATED_PATCH | Freq: Every day | TRANSDERMAL | Status: DC
  Filled 2015-06-14: qty 1

## 2015-06-14 NOTE — Progress Notes (Signed)
Informed by nursing staff patient left AMA

## 2015-06-14 NOTE — H&P (Signed)
Denise Bass is an 44 y.o. female.   Chief Complaint: Upset stomach HPI: The patient presents emergency department complaining of nausea, vomiting and diarrhea 3-4 days. Patient also admits to feeling as if she had a fever. She has had chills but has not taken her temperature. She also states that her hands and feet are numb but that specifically her hands hurt which is different than the symptoms usually associated with her peripheral neuropathy. She also complains of some chest pain and shortness of breath that she has not mentioned before. States that the pain is intermittent. She usually uses supplemental oxygen at night but has needed to use some throughout the day today. In the emergency department the patient was found to have acute kidney injury which prompted emergency department staff to call for admission.  Past Medical History  Diagnosis Date  . Diabetes mellitus without complication   . Anxiety   . Arthritis   . COPD (chronic obstructive pulmonary disease)   . Emphysema of lung   . GERD (gastroesophageal reflux disease)   . Hypertension   . 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 Neuromuscular disorder   . Osteoarthritis of both knees   . AC (acromioclavicular) joint bone spurs     knees  . Stroke   . Substance abuse   . Genetic blood cell disorder     Rare/unknown/still researching at [facility]/    Past Surgical History  Procedure Laterality Date  . Cholecystectomy    . Dilation and curettage of uterus    . Tonsilectomy, adenoidectomy, bilateral myringotomy and tubes    . Corneal transplant      Family History  Problem Relation Age of Onset  . Stroke Mother   . COPD Mother   . Emphysema Mother   . Asthma Mother   . Liver disease Father   . Stroke Father     cerebral hemrhage   Social History:  reports that she has been smoking Cigarettes.  She has a 58 pack-year smoking history. She has never used smokeless tobacco. She  reports that she uses illicit drugs (Marijuana) about 7 times per week. She reports that she does not drink alcohol.  Allergies:  Allergies  Allergen Reactions  . Latex Rash and Other (See Comments)    Skin agitation and Rash  . Penicillins Rash    Medications Prior to Admission  Medication Sig Dispense Refill  . albuterol (PROVENTIL HFA;VENTOLIN HFA) 108 (90 BASE) MCG/ACT inhaler Inhale 1-2 puffs into the lungs every 6 (six) hours as needed for wheezing or shortness of breath.    Marland Kitchen aspirin EC 81 MG tablet Take 81 mg by mouth daily.    . clonazePAM (KLONOPIN) 1 MG tablet Take 1 mg by mouth 2 (two) times daily.     . DULoxetine (CYMBALTA) 60 MG capsule Take 60 mg by mouth daily.     . furosemide (LASIX) 20 MG tablet Take 20 mg by mouth daily.     Marland Kitchen gabapentin (NEURONTIN) 300 MG capsule Take 300 mg by mouth 3 (three) times daily.     Marland Kitchen glipiZIDE (GLUCOTROL XL) 10 MG 24 hr tablet Take 10 mg by mouth 2 (two) times daily.     Marland Kitchen lisinopril (PRINIVIL,ZESTRIL) 20 MG tablet Take 20 mg by mouth daily.     . metoprolol succinate (TOPROL-XL) 50 MG 24 hr tablet Take 50 mg by mouth daily.     . simvastatin (ZOCOR)  10 MG tablet Take 10 mg by mouth daily.    . sitaGLIPtin (JANUVIA) 100 MG tablet Take 100 mg by mouth daily.    . solifenacin (VESICARE) 5 MG tablet Take 5 mg by mouth daily.    . traMADol (ULTRAM) 50 MG tablet Take 50 mg by mouth 2 (two) times daily.     . traZODone (DESYREL) 100 MG tablet Take 100 mg by mouth at bedtime.       Results for orders placed or performed during the hospital encounter of 06/13/15 (from the past 48 hour(s))  CBC with Differential     Status: Abnormal   Collection Time: 06/13/15  8:46 PM  Result Value Ref Range   WBC 21.0 (H) 3.6 - 11.0 K/uL   RBC 6.24 (H) 3.80 - 5.20 MIL/uL   Hemoglobin 14.5 12.0 - 16.0 g/dL   HCT 44.9 35.0 - 47.0 %   MCV 72.0 (L) 80.0 - 100.0 fL   MCH 23.2 (L) 26.0 - 34.0 pg   MCHC 32.2 32.0 - 36.0 g/dL   RDW 17.6 (H) 11.5 - 14.5 %    Platelets 592 (H) 150 - 440 K/uL   Neutrophils Relative % 80 %   Neutro Abs 17.0 (H) 1.4 - 6.5 K/uL   Lymphocytes Relative 11 %   Lymphs Abs 2.2 1.0 - 3.6 K/uL   Monocytes Relative 7 %   Monocytes Absolute 1.4 (H) 0.2 - 0.9 K/uL   Eosinophils Relative 1 %   Eosinophils Absolute 0.1 0 - 0.7 K/uL   Basophils Relative 1 %   Basophils Absolute 0.1 0 - 0.1 K/uL  Comprehensive metabolic panel     Status: Abnormal   Collection Time: 06/13/15  8:46 PM  Result Value Ref Range   Sodium 124 (L) 135 - 145 mmol/L   Potassium 3.9 3.5 - 5.1 mmol/L   Chloride 85 (L) 101 - 111 mmol/L   CO2 23 22 - 32 mmol/L   Glucose, Bld 454 (H) 65 - 99 mg/dL   BUN 46 (H) 6 - 20 mg/dL   Creatinine, Ser 3.11 (H) 0.44 - 1.00 mg/dL   Calcium 10.0 8.9 - 10.3 mg/dL   Total Protein 9.4 (H) 6.5 - 8.1 g/dL   Albumin 3.5 3.5 - 5.0 g/dL   AST 29 15 - 41 U/L   ALT 23 14 - 54 U/L   Alkaline Phosphatase 129 (H) 38 - 126 U/L   Total Bilirubin 0.9 0.3 - 1.2 mg/dL   GFR calc non Af Amer 17 (L) >60 mL/min   GFR calc Af Amer 20 (L) >60 mL/min    Comment: (NOTE) The eGFR has been calculated using the CKD EPI equation. This calculation has not been validated in all clinical situations. eGFR's persistently <60 mL/min signify possible Chronic Kidney Disease.    Anion gap 16 (H) 5 - 15  Lipase, blood     Status: None   Collection Time: 06/13/15  8:46 PM  Result Value Ref Range   Lipase 36 22 - 51 U/L  Troponin I     Status: None   Collection Time: 06/13/15  8:46 PM  Result Value Ref Range   Troponin I <0.03 <0.031 ng/mL    Comment:        NO INDICATION OF MYOCARDIAL INJURY.   Glucose, capillary     Status: Abnormal   Collection Time: 06/13/15 10:40 PM  Result Value Ref Range   Glucose-Capillary 395 (H) 65 - 99 mg/dL  Urinalysis complete, with microscopic (  [place] only)     Status: Abnormal   Collection Time: 06/13/15 10:45 PM  Result Value Ref Range   Color, Urine YELLOW (A) YELLOW   APPearance CLEAR (A) CLEAR    Glucose, UA >500 (A) NEGATIVE mg/dL   Bilirubin Urine NEGATIVE NEGATIVE   Ketones, ur NEGATIVE NEGATIVE mg/dL   Specific Gravity, Urine 1.010 1.005 - 1.030   Hgb urine dipstick 1+ (A) NEGATIVE   pH 6.0 5.0 - 8.0   Protein, ur 30 (A) NEGATIVE mg/dL   Nitrite NEGATIVE NEGATIVE   Leukocytes, UA NEGATIVE NEGATIVE   RBC / HPF NONE SEEN 0 - 5 RBC/hpf   WBC, UA 0-5 0 - 5 WBC/hpf   Bacteria, UA NONE SEEN NONE SEEN   Squamous Epithelial / LPF 0-5 (A) NONE SEEN   Mucous PRESENT   Blood gas, venous     Status: Abnormal   Collection Time: 06/13/15 10:45 PM  Result Value Ref Range   FIO2 ROOM AIR    pH, Ven 7.45 (H) 7.320 - 7.430   pCO2, Ven 36 (L) 44.0 - 60.0 mmHg   Bicarbonate 25.0 21.0 - 28.0 mEq/L   Acid-Base Excess 1.3 0.0 - 3.0 mmol/L   Patient temperature 37.0    Collection site VEIN    Sample type VEIN   Lactic acid, plasma     Status: None   Collection Time: 06/13/15 11:56 PM  Result Value Ref Range   Lactic Acid, Venous 1.5 0.5 - 2.0 mmol/L  Glucose, capillary     Status: Abnormal   Collection Time: 06/14/15  1:55 AM  Result Value Ref Range   Glucose-Capillary 337 (H) 65 - 99 mg/dL   No results found.  Review of Systems  Constitutional: Positive for fever (Subjective) and chills.  HENT: Negative for sore throat and tinnitus.   Eyes: Negative for blurred vision and redness.  Respiratory: Negative for cough and shortness of breath.   Cardiovascular: Negative for chest pain, palpitations, orthopnea and PND.  Gastrointestinal: Positive for nausea, vomiting, abdominal pain and diarrhea.  Genitourinary: Negative for dysuria, urgency and frequency.  Musculoskeletal: Negative for myalgias and joint pain.  Skin: Negative for rash.       No lesions  Neurological: Negative for speech change, focal weakness and weakness.  Endo/Heme/Allergies: Does not bruise/bleed easily.       No temperature intolerance  Psychiatric/Behavioral: Negative for depression and suicidal ideas.     Blood pressure 138/83, pulse 91, temperature 98.2 F (36.8 C), temperature source Oral, resp. rate 18, height $RemoveBe'5\' 5"'hBPfnpiMv$  (1.651 m), weight 104.327 kg (230 lb), SpO2 100 %. Physical Exam  Vitals reviewed. Constitutional: She is oriented to person, place, and time. She appears well-developed and well-nourished. No distress.  HENT:  Head: Normocephalic and atraumatic.  Mouth/Throat: Oropharynx is clear and moist.  Eyes: Conjunctivae and EOM are normal. Pupils are equal, round, and reactive to light. No scleral icterus.  Neck: Normal range of motion. Neck supple. No JVD present. No tracheal deviation present. No thyromegaly present.  Cardiovascular: Normal rate, regular rhythm and normal heart sounds.  Exam reveals no gallop and no friction rub.   No murmur heard. Respiratory: Effort normal and breath sounds normal.  GI: Soft. Bowel sounds are normal. She exhibits no distension. There is no tenderness.  Genitourinary:  Deferred  Musculoskeletal: Normal range of motion. She exhibits edema (Trace).  Lymphadenopathy:    She has no cervical adenopathy.  Neurological: She is alert and oriented to person, place, and time. No cranial  nerve deficit. She exhibits normal muscle tone.  Skin: Skin is warm and dry. No rash noted. No erythema.  Psychiatric: Her behavior is normal. Judgment and thought content normal. Her mood appears anxious.     Assessment/Plan This is a 44 year old female admitted for acute kidney injury. 1. Acute kidney injury: Secondary to dehydration due to diarrhea, nausea and vomiting. We'll aggressively hydrate patient with intravenous fluid and avoid nephrotoxic agents. Hold metformin. 2. Diarrhea: Associated with leukocytosis. Will obtain differential on CBC with this is likely viral gastroenteritis. The patient have a bland diet and we will advance her diet as tolerated. Anti-emetics as needed. 3. 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 Hold oral  hypoglycemics and start patient on sliding scale insulin while hospitalized 4. Hypertension: Continue metoprolol and lisinopril (long-term med highly unlikely to have caused acute kidney injury)  5. COPD: Nocturnal O2 as needed 6. Depression: Patient does not endorse suicidal or homicidal ideation. She would like to speak with behavioral health specialist 7. DVT Proflex is: Heparin 8. GI prophylaxis: None The patient is a DO NOT RESUSCITATE. Time spent on admission orders and patient care approximately 35 minutes  Harrie Foreman 06/14/2015, 4:23 AM
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
## 2015-06-14 NOTE — Clinical Documentation Improvement (Signed)
Would you please help clarify the clinical significance of WBC of 21,000?  Thank you, Elmer Sow, RN, BSN, MBA, CNML Clinical Documentation Specialist

## 2015-06-14 NOTE — ED Notes (Signed)
Hospitalist in to see and assess patient for admission to the hospital.

## 2015-06-14 NOTE — Progress Notes (Signed)
Inpatient Diabetes Program Recommendations  AACE/ADA: New Consensus Statement on Inpatient Glycemic Control (2013)  Target Ranges:  Prepandial:   less than 140 mg/dL      Peak postprandial:   less than 180 mg/dL (1-2 hours)      Critically ill patients:  140 - 180 mg/dL   Review of Glycemic Control:  Results for Denise Bass, Denise Bass (MRN [id number]) as of 06/14/2015 12:34  Ref. Range 06/13/2015 22:40 06/14/2015 01:55 06/14/2015 07:27  Glucose-Capillary Latest Ref Range: 65-99 mg/dL 829 (H) 562 (H) 130 (H)   Diabetes history: Type 2 diabetes Outpatient Diabetes medications: Glucotrol XL 10 mg bid, Januvia 100 mg daily Current orders for Inpatient glycemic control:  Novolog moderate tid with meals and HS  Please check A1C to determine pre-hospitalization glycemic control.  Also, please consider adding Lantus 15 units daily (0.15 unit/kg).    Thanks, Beryl Meager, RN, BC-ADM Inpatient Diabetes Coordinator Pager [phone] (8a-5p)

## 2015-06-14 NOTE — Clinical Documentation Improvement (Signed)
Would you please help clarify diagnosis on past medical history and current condition?  BUN 46 and 39.  Creatinine - 3.11 and 2.35.  GFR 20 and 28.   _______CKD Stage I - GFR > OR = 90 _______CKD Stage II - GFR 60-80 _______CKD Stage III - GFR 30-59 _______CKD Stage IV - GFR 15-29 _______CKD Stage V - GFR < 15 _______ESRD (End Stage Renal Disease) _______Other condition_____________ _______Cannot Clinically determine   Thank You, Harrie Jeans ,RN Clinical Documentation Specialist:    [facility]- Health Information Management

## 2015-06-14 NOTE — ED Notes (Signed)
Inpatient nurse called by this RN to make her aware of the interventions that have been done since report. Nurse aware of CBG check (337), insulin administration (Novolog 4 units sq), and discontinuation of lactate. 2C nurse aware that patient en route at this time.

## 2015-06-14 NOTE — ED Notes (Signed)
Spoke with Sheryle Hail, MD regarding lactate order that is pending. Md does not want this; RN to discontinue. MD made aware of continued elevation of CBG; advises that he just placed glucose stabilization orders; will treat using bedtime dosing schedule per MD order. MD questioned about elevated WBC; will hold at this time secondary to feelings that this may be a viral gastroenteritis per Dr. Sheryle Hail.

## 2015-06-14 NOTE — Progress Notes (Addendum)
Pt left AMA. Supervisor notified and present during pt's decision. Pt upset about situation going on at her home and would not elaborate any further. Supervisor notified prime doc.  IV removed & intact. Pt wondering about anti-depressant that psyc doctor had spoken with her about. Informed pt that no order for any anti-depressants had been placed at this time. Will resume home meds. Pt called family friend to transport her home. CNA transported pt downstairs via wheelchair.   Unable to resolve care plans & education due to pt leaving AMA.

## 2015-06-14 NOTE — Progress Notes (Signed)
Initial Nutrition Assessment       INTERVENTION:  Nutrition diet education:  RD consulted for nutrition education regarding diabetes.  RD provided "Carbohydrate Counting for People with Diabetes" handout from the Academy of Nutrition and Dietetics. Discussed different food groups and their effects on blood sugar, emphasizing carbohydrate-containing foods. Provided list of carbohydrates and recommended serving sizes of common foods.  Discussed importance of controlled and consistent carbohydrate intake throughout the day. Provided examples of ways to balance meals/snacks and encouraged intake of high-fiber, whole grain complex carbohydrates. Teach back method used.  Expect good compliance.   NUTRITION DIAGNOSIS:   Food and nutrition related knowledge deficit related to chronic illness as evidenced by  (consult to educate).    GOAL:   Patient will meet greater than or equal to 90% of their needs    MONITOR:    (Energy intake, glucose profile)  REASON FOR ASSESSMENT:   Consult Diet education  ASSESSMENT:      Pt admitted with acute kidney injury, nausea, vomiting diarrhea for 3-4 days prior to admission  Past Medical History  Diagnosis Date  . Diabetes mellitus without complication   . Anxiety   . Arthritis   . COPD (chronic obstructive pulmonary disease)   . Emphysema of lung   . GERD (gastroesophageal reflux disease)   . Hypertension   . 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 Neuromuscular disorder   . Osteoarthritis of both knees   . AC (acromioclavicular) joint bone spurs     knees  . Stroke   . Substance abuse   . Genetic blood cell disorder     Rare/unknown/still researching at [facility]/    Current Nutrition: tolerating solid foods  Food/Nutrition-Related History: decreased intake 3-4 days prior to admission secondary to nausea, vomiting diarrhea   Medications: NS at 176ml/hr, colace, lasix,  aspart  Electrolyte/Renal Profile and Glucose Profile:   Recent Labs Lab 06/13/15 2046 06/14/15 0737  NA 124* 134*  K 3.9 3.7  CL 85* 94*  CO2 23 28  BUN 46* 39*  CREATININE 3.11* 2.35*  CALCIUM 10.0 9.5  GLUCOSE 454* 240*   Protein Profile:  Recent Labs Lab 06/13/15 2046  ALBUMIN 3.5    Gastrointestinal Profile: Last BM: 8/16 watery BM    Weight Change: Pt reports intentional weight loss prior to admission    Diet Order:  Diet Carb Modified Fluid consistency:: Thin; Room service appropriate?: Yes  Skin:   reviewed   Height:   Ht Readings from Last 1 Encounters:  06/13/15  (1.651 m)    Weight:   Wt Readings from Last 1 Encounters:  06/14/15 223 lb 14.4 oz (101.56 kg)     BMI:  Body mass index is 37.26 kg/(m^2).   EDUCATION NEEDS:   No education needs identified at this time  LOW Care Level  Resean Brander B. Freida Busman, RD, LDN [phone] (pager)
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
## 2015-06-14 NOTE — Consult Note (Signed)
  Pt was seen in Consultation in [facility]. S Pt is a 44 yr old white female,  Not employed and is on disability for being Legally blind  And is gttign hard of hering due to a rare metabolic disease for which she is being followed at [facility]. Pt is separated since 2010 after being married in 2008 and has been living with a room mate in an apt. Pt comes to [facility] with cc" sick in stomach." H/O Present Illness . Pt reports that she has been paranoid to the extent that she is scared to go outside as she does not trust and the more she is hard of hearing the worse it is getting. Past Psych history. H/O Inpt to psychiatry many yrs ago to [facility] . H/O suicide attempt many yrs ago. M.s. Alert and ox3. Uses hearingaid for help with hearing. Affect is flat with mood low and down about her hearing and vision problems.  Admits being paranoid about everything around since her  Hearing is getting worse and  Worse. Denies s/h ideas or plans and wants to get help for her paranoia. Contracts for safety. I/j fair. Impulse control is fair. Imp MDD recurrent with psychosis. REc start pt on anti-depressant and anti-psychotic to help with her depression and paranoia.

## 2015-06-14 NOTE — Progress Notes (Signed)
   06/14/15 1000  Clinical Encounter Type  Visited With Patient  Visit Type Initial  Referral From Nurse  Spiritual Encounters  Spiritual Needs Prayer  Stress Factors  Patient Stress Factors Health changes  Family Stress Factors Exhausted  Chaplain Gauge Winski engaged patient. Patient stated she had health challenges. Chaplain offered prayer and support. Chaplain will follow up. Denise Bass

## 2015-06-14 NOTE — Progress Notes (Addendum)
[facility] - [place] at [facility]   PATIENT NAME: Denise Bass    MR#:  [id number]  DATE OF BIRTH:  Jan 19, 1971  SUBJECTIVE:  CHIEF COMPLAINT:   Chief Complaint  Patient presents with  . Abdominal Pain  . Emesis   Patient is 44 year old Caucasian female with history of obesity diabetes, emphysema. CK D who presents to the hospital with complaints of nausea, vomiting, diarrhea for approximately 3-4 days prior to arrival. She has no nausea or vomiting at present, however, still continues to have loose bowel movements. Patient was noted to have acute on chronic renal failure, which improved on IV fluid administration. Urinalysis was unremarkable, not suggestive of UTI. Patient was noted to be paranoid by nursing staff, nursing staff and asked for psychiatry consultation . She complains of lower abdominal pain ROS  VITAL SIGNS: Blood pressure 146/100, pulse 91, temperature 97.8 F (36.6 C), temperature source Oral, resp. rate 18, height 5\' 5"  (1.651 m), weight 101.56 kg (223 lb 14.4 oz), SpO2 98 %.  PHYSICAL EXAMINATION:   GENERAL:  44 y.o.-year-old patient lying in the bed with no acute distress. On my arrival to the room the  patient that this starts talking about power of attorney, who is her sister sitting by, explained that as long as she is able to make decisions about power of attorney decisions are not valid EYES: Pupils equal, round, left corneal opacification . No scleral icterus. Extraocular muscles intact. Strange affect surprised facial expression  HEENT: Head atraumatic, normocephalic. Oropharynx and nasopharynx clear.  NECK:  Supple, no jugular venous distention. No thyroid enlargement, no tenderness.  LUNGS: Normal breath sounds bilaterally, no wheezing, rales,rhonchi or crepitation. No use of accessory muscles of respiration.  CARDIOVASCULAR: S1, S2 normal. No murmurs, rubs, or gallops.  ABDOMEN: Soft, diffuse tenderness with no significant rebound,  although evaluation is limited due to patient's poor cooperation, nondistended. Bowel sounds present. No organomegaly or mass.  EXTREMITIES: No pedal edema, cyanosis, or clubbing.  NEUROLOGIC: Cranial nerves II through XII are intact. Muscle strength 5/5 in all extremities. Sensation intact. Gait not checked.  PSYCHIATRIC: The patient is alert and oriented x 3.  SKIN: No obvious rash, lesion, or ulcer.   ORDERS/RESULTS REVIEWED:   CBC  Recent Labs Lab 06/13/15 2046 06/14/15 0737  WBC 21.0* 15.1*  HGB 14.5 12.9  HCT 44.9 40.5  PLT 592* 472*  MCV 72.0* 72.6*  MCH 23.2* 23.0*  MCHC 32.2 31.7*  RDW 17.6* 17.2*  LYMPHSABS 2.2  --   MONOABS 1.4*  --   EOSABS 0.1  --   BASOSABS 0.1  --    ------------------------------------------------------------------------------------------------------------------  Chemistries   Recent Labs Lab 06/13/15 2046 06/14/15 0737  NA 124* 134*  K 3.9 3.7  CL 85* 94*  CO2 23 28  GLUCOSE 454* 240*  BUN 46* 39*  CREATININE 3.11* 2.35*  CALCIUM 10.0 9.5  AST 29  --   ALT 23  --   ALKPHOS 129*  --   BILITOT 0.9  --    ------------------------------------------------------------------------------------------------------------------ estimated creatinine clearance is 37.2 mL/min (by C-G formula based on Cr of 2.35). ------------------------------------------------------------------------------------------------------------------  Recent Labs  06/13/15 2046  TSH 0.340*    Cardiac Enzymes  Recent Labs Lab 06/13/15 2046  TROPONINI <0.03   ------------------------------------------------------------------------------------------------------------------ Invalid input(s): POCBNP ---------------------------------------------------------------------------------------------------------------  RADIOLOGY: No results found.  EKG:  Orders placed or performed in visit on 08/29/14  . EKG 12-Lead    ASSESSMENT AND PLAN:  Active Problems:  Acute on chronic kidney failure 1. Acute on chronic renal failure, probably on IV fluids, continue IV fluids and follow kidney function tomorrow morning. Urinalysis was unremarkable. Get kidney ultrasound, stop Lasix as well as lisinopril 2. Acute gastroenteritis, as stool cultures including C. difficile. Change diet to clear liquid diet and advance as tolerated 3. Hyponatremia, improving with IV fluids ,4.  Leukocytosis, improving with therapy, patient is not on antibiotic therapy at present 5. History of substance abuse. Get urine drug screen 6. Paranoid behavior. His psychologist involved for further recommendations   Management plans discussed with the patient, family and they are in agreement.   DRUG ALLERGIES:  Allergies  Allergen Reactions  . Latex Rash and Other (See Comments)    Skin agitation and Rash  . Penicillins Rash    CODE STATUS:     Code Status Orders        Start     Ordered   06/14/15 0525  Do not attempt resuscitation (DNR)   Continuous    Question Answer Comment  In the event of cardiac or respiratory ARREST Do not call a "code blue"   In the event of cardiac or respiratory ARREST Do not perform Intubation, CPR, defibrillation or ACLS   In the event of cardiac or respiratory ARREST Use medication by any route, position, wound care, and other measures to relive pain and suffering. May use oxygen, suction and manual treatment of airway obstruction as needed for comfort.      06/14/15 0524    Advance Directive Documentation        Most Recent Value   Type of Advance Directive  Healthcare Power of Attorney   Pre-existing out of facility DNR order (yellow form or pink MOST form)     "MOST" Form in Place?        TOTAL TIME TAKING CARE OF THIS PATIENT: 50 minutes.  Plan discussion about multiple medical problems with patient as well as her family, all questions answered, voiced understanding. Time spent approximately 20 minutes for  discussions  Ladarius Seubert M.D on 06/14/2015 at 3:56 PM  Between 7am to 6pm - Pager - [phone]  After 6pm go to [email or web address] - password [email or web address] [facility]  [place] [facility]  Office  [phone]  CC: Primary care physician; No primary care provider on file.

## 2015-06-15 LAB — HEMOGLOBIN A1C: Hgb A1c MFr Bld: 8 % — ABNORMAL HIGH (ref 4.0–6.0)

## 2015-06-17 ENCOUNTER — Telehealth: Payer: Self-pay

## 2015-06-17 NOTE — Telephone Encounter (Signed)
Pt pharmacy is requesting a refill on vesicare . Pt was last seen 05/2014. Please advise.

## 2015-06-19 NOTE — Telephone Encounter (Signed)
Patient will need an office visit to refill her Vesicare.

## 2015-06-20 NOTE — Discharge Summary (Signed)
[facility] - [place] at [facility]   PATIENT NAME: Denise Bass    MR#:  [id number]  DATE OF BIRTH:  April 01, 1971  DATE OF ADMISSION:  06/13/2015 ADMITTING PHYSICIAN: Arnaldo Natal, MD  DATE OF DISCHARGE: 06/14/2015 10:45 PM  PRIMARY CARE PHYSICIAN: No primary care provider on file.     ADMISSION DIAGNOSIS:  Hyperglycemia [R73.9] AKI (acute kidney injury) [N17.9]  DISCHARGE DIAGNOSIS:  Active Problems:   Acute on chronic kidney failure   SECONDARY DIAGNOSIS:   Past Medical History  Diagnosis Date  . Diabetes mellitus without complication   . Anxiety   . Arthritis   . COPD (chronic obstructive pulmonary disease)   . Emphysema of lung   . GERD (gastroesophageal reflux disease)   . Hypertension   . 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 Neuromuscular disorder   . Osteoarthritis of both knees   . AC (acromioclavicular) joint bone spurs     knees  . Stroke   . Substance abuse   . Genetic blood cell disorder     Rare/unknown/still researching at [place]/    .pro HOSPITAL COURSE:   Patient is 44 year old female with past medical history significant for history of diabetes, anxiety, COPD, emphysema and multiple other medical problems who presents to the hospital with complaints of nausea, vomiting and diarrhea for 3 or 4 days prior to admission. She also felt feverish and complained of some sensory problems in her hands and feet. On arrival to emergency room, patient's sodium was noted to be 124, creatinine of 3.11 with estimated GFR of 17-20, her white blood cell count also was found to be high at 21,000. Patient was given IV fluids and her sodium level as well as kidney function improved. No antibiotics were administered and patient's white blood cell count still declining and 15.1 by the 16th of August. Unfortunately, no stool or blood cultures were taken while patient was in the hospital. She was noted to be  paranoid. On admission to the floor and nurses were concerned about her behavior,  psychiatry consultation was obtained. Dr. 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 Unfortunately, patient did not want to continue therapy and decided to sign out AGAINST MEDICAL ADVICE on 06/14/2015.  Discussion by problem 1. Acute on chronic renal failure, improved on IV fluids. Urinalysis was unremarkable. We recommended to get  kidney ultrasound, stop Lasix as well as lisinopril, but unfortunately patient's sign out against medical advise. It is recommended to follow her kidney function closely as outpatient.  2. Acute gastroenteritis, which right to obtain stool cultures including C. Difficile, unfortunately it was not done while she was in the hospital. Follow-up as outpatient and make decisions about therapy if needed.  3. Hyponatremia, improving with IV fluids ,4. Leukocytosis, improving with therapy, patient is not on antibiotic therapy at present, etiology remains unknown, but suspected due to acute gastroenteritis.  5. History of substance abuse.  Ordered urine drug screen, not obtained while she was in the hospital 6. Paranoid behavior. 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 7.  Diabetes mellitus. Hemoglobin A1c 8.0. Patient was seen by diabetes coordinator, who  felt that patient would benefit from adding Lantus at 15 units daily dose, again, unfortunately, as patient left AGAINST MEDICAL ADVICE, this was not prescribed for her.  DISCHARGE CONDITIONS:   Guarded  CONSULTS OBTAINED:     DRUG ALLERGIES:   Allergies  Allergen Reactions  .  Latex Rash and Other (See Comments)    Skin agitation and Rash  . Penicillins Rash    DISCHARGE MEDICATIONS:   Discharge Medication List as of 06/14/2015 10:46 PM    CONTINUE these medications which have NOT CHANGED   Details  albuterol (PROVENTIL HFA;VENTOLIN HFA) 108 (90 BASE) MCG/ACT inhaler Inhale 1-2 puffs into the lungs every 6 (six) hours as needed for wheezing or shortness of breath., Until Discontinued, Historical Med    aspirin EC 81 MG tablet Take 81 mg by mouth daily., Until Discontinued, Historical Med    clonazePAM (KLONOPIN) 1 MG tablet Take 1 mg by mouth 2 (two) times daily. , Starting 02/13/2013, Until Discontinued, Historical Med    DULoxetine (CYMBALTA) 60 MG capsule Take 60 mg by mouth daily. , Starting 02/16/2013, Until Discontinued, Historical Med    furosemide (LASIX) 20 MG tablet Take 20 mg by mouth daily. , Starting 02/13/2013, Until Discontinued, Historical Med    gabapentin (NEURONTIN) 300 MG capsule Take 300 mg by mouth 3 (three) times daily. , Starting 02/13/2013, Until Discontinued, Historical Med    glipiZIDE (GLUCOTROL XL) 10 MG 24 hr tablet Take 10 mg by mouth 2 (two) times daily. , Starting 01/27/2013, Until Discontinued, Historical Med    lisinopril (PRINIVIL,ZESTRIL) 20 MG tablet Take 20 mg by mouth daily. , Starting 02/13/2013, Until Discontinued, Historical Med    metoprolol succinate (TOPROL-XL) 50 MG 24 hr tablet Take 50 mg by mouth daily. , Starting 02/16/2013, Until Discontinued, Historical Med    simvastatin (ZOCOR) 10 MG tablet Take 10 mg by mouth daily., Until Discontinued, Historical Med    sitaGLIPtin (JANUVIA) 100 MG tablet Take 100 mg by mouth daily., Until Discontinued, Historical Med    solifenacin (VESICARE) 5 MG tablet Take 5 mg by mouth daily., Until Discontinued, Historical Med    traMADol (ULTRAM) 50 MG tablet Take 50 mg by mouth 2 (two) times daily. , Starting 02/17/2013, Until Discontinued, Historical Med    traZODone (DESYREL) 100  MG tablet Take 100 mg by mouth at bedtime. , Starting 02/13/2013, Until Discontinued, Historical Med         DISCHARGE INSTRUCTIONS:    Patient is to follow-up with her primary care physician as outpatient  If you experience worsening of your admission symptoms, develop shortness of breath, life threatening emergency, suicidal or homicidal thoughts you must seek medical attention immediately by calling 911 or calling your MD immediately  if symptoms less severe.  You Must read complete instructions/literature along with all the possible adverse reactions/side effects for all the Medicines you take and that have been prescribed to you. Take any new Medicines after you have completely understood and accept all the possible adverse reactions/side effects.   Please note  You were cared for by a hospitalist during your hospital stay. If you have any questions about your discharge medications or the care you received while you were in the hospital after you are discharged, you can call the unit and asked to speak with the hospitalist on call if the hospitalist that took care of you is not available. Once you are discharged, your primary care physician will handle any further medical issues. Please note that NO REFILLS  for any discharge medications will be authorized once you are discharged, as it is imperative that you return to your primary care physician (or establish a relationship with a primary care physician if you do not have one) for your aftercare needs so that they can reassess your need for medications and monitor your lab values.    Today   CHIEF COMPLAINT:   Chief Complaint  Patient presents with  . Abdominal Pain  . Emesis    HISTORY OF PRESENT ILLNESS:  Denise Bass  is a 44 y.o. female with a known history of diabetes, anxiety, COPD, emphysema and multiple other medical problems who presents to the hospital with complaints of nausea, vomiting and diarrhea for 3 or 4 days prior  to admission. She also felt feverish and complained of some sensory problems in her hands and feet. On arrival to emergency room, patient's sodium was noted to be 124, creatinine of 3.11 with estimated GFR of 17-20, her white blood cell count also was found to be high at 21,000. Patient was given IV fluids and her sodium level as well as kidney function improved. No antibiotics were administered and patient's white blood cell count still declining and 15.1 by the 16th of August. Unfortunately, no stool or blood cultures were taken while patient was in the hospital. She was noted to be paranoid. On admission to the floor and nurses were concerned about her behavior,  psychiatry consultation was obtained. Dr. 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 Unfortunately, patient did not want to continue therapy and decided to sign out AGAINST MEDICAL ADVICE on 06/14/2015.  Discussion by problem 1. Acute on chronic renal failure, improved on IV fluids. Urinalysis was unremarkable. We recommended to get  kidney ultrasound, stop Lasix as well as lisinopril, but unfortunately patient's sign out against medical advise. It is recommended to follow her kidney function closely as outpatient.  2. Acute gastroenteritis, which right to obtain stool cultures including C. Difficile, unfortunately it was not done while she was in the hospital. Follow-up as outpatient and make decisions about therapy if needed.  3. Hyponatremia, improving with IV fluids ,4. Leukocytosis, improving with therapy, patient is not on antibiotic therapy at present, etiology remains unknown, but suspected due to acute gastroenteritis.  5. History of substance abuse.  Ordered urine drug screen, not obtained while she was in the hospital 6. Paranoid behavior. 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 7.  Diabetes mellitus. Hemoglobin A1c 8.0. Patient was seen by diabetes coordinator, who felt that patient would benefit from adding Lantus at 15 units daily dose, again, unfortunately, as patient left AGAINST MEDICAL ADVICE, this was not prescribed for her.  DISCHARGE CONDITIONS:   Guarded  VITAL SIGNS:  Blood pressure 132/77, pulse 86, temperature 97.8 F (36.6 C), temperature source Oral, resp. rate 18, height 5\' 5"  (1.651 m), weight 101.56 kg (223 lb 14.4 oz), SpO2 98 %.  I/O:  No intake or output data in the 24 hours ending 06/20/15 1058  PHYSICAL EXAMINATION:  GENERAL:  44 y.o.-year-old patient lying in the bed with no acute distress.  EYES: Pupils equal, round, reactive to light and accommodation. No scleral icterus. Extraocular muscles intact.  HEENT:  Head atraumatic, normocephalic. Oropharynx and nasopharynx clear.  NECK:  Supple, no jugular venous distention. No thyroid enlargement, no tenderness.  LUNGS: Normal breath sounds bilaterally, no wheezing, rales,rhonchi or crepitation. No use of accessory muscles of respiration.  CARDIOVASCULAR: S1, S2 normal. No murmurs, rubs, or gallops.  ABDOMEN: Soft, non-tender, non-distended. Bowel sounds present. No organomegaly or mass.  EXTREMITIES: No pedal edema, cyanosis, or clubbing.  NEUROLOGIC: Cranial nerves II through XII are intact. Muscle strength 5/5 in all extremities. Sensation intact. Gait not checked.  PSYCHIATRIC: The patient is alert and oriented x 3.  SKIN: No obvious rash, lesion, or ulcer.   DATA REVIEW:   CBC  Recent Labs Lab 06/14/15 0737  WBC 15.1*  HGB 12.9  HCT 40.5  PLT 472*    Chemistries   Recent Labs Lab 06/13/15 2046 06/14/15 0737  NA 124* 134*  K 3.9 3.7  CL 85* 94*  CO2 23 28   GLUCOSE 454* 240*  BUN 46* 39*  CREATININE 3.11* 2.35*  CALCIUM 10.0 9.5  AST 29  --   ALT 23  --   ALKPHOS 129*  --   BILITOT 0.9  --     Cardiac Enzymes  Recent Labs Lab 06/13/15 2046  TROPONINI <0.03    Microbiology Results  Results for orders placed or performed in visit on 08/29/14  Clostridium Difficile [facility])     Status: None   Collection Time: 08/29/14 11:28 PM  Result Value Ref Range Status   Micro Text Report   Final       C.DIFFICILE ANTIGEN       C.DIFFICILE GDH ANTIGEN : NEGATIVE   C.DIFFICILE TOXIN A/B     C.DIFFICILE TOXINS A AND B : NEGATIVE   INTERPRETATION            Negative for C. difficile.    ANTIBIOTIC                                                      Stool culture     Status: None   Collection Time: 08/29/14 11:28 PM  Result Value Ref Range Status   Micro Text Report   Final       COMMENT                   NO SALMONELLA OR SHIGELLA ISOLATED   COMMENT                   NO PATHOGENIC E.COLI DETECTED   COMMENT                   NO CAMPYLOBACTER ANTIGEN DETECTED   ANTIBIOTIC                                                        RADIOLOGY:  No results found.  EKG:   Orders placed or performed in visit on 08/29/14  . EKG 12-Lead      Management plans discussed with the patient, family and they are in agreement.  CODE STATUS:  Advance Directive Documentation        Most Recent Value   Type of Advance Directive  Healthcare Power of Attorney   Pre-existing out of facility DNR order (yellow form or pink MOST form)     "MOST" Form in Place?        TOTAL TIME TAKING CARE OF THIS PATIENT: 40 minutes.    Katharina Caper M.D on 06/20/2015 at 10:58 AM  Between 7am to 6pm - Pager - [phone]  After 6pm go to [email or web address] - password [email or web address] [facility]  [place]  Hospital[facility]  Office  [phone]  CC: Primary care physician; No primary care provider on file.

## 2015-06-21 NOTE — Telephone Encounter (Signed)
Spoke with pt in reference to vesicare. Pt stated she needs vesicare. Made pt aware she needs a f/u appt. Pt was transferred to the front to make f/u appt.

## 2015-06-21 NOTE — Telephone Encounter (Signed)
Pt was asleep, left message with son to return call.

## 2015-06-27 ENCOUNTER — Encounter: Payer: Self-pay | Admitting: *Deleted

## 2015-07-06 ENCOUNTER — Ambulatory Visit: Payer: Self-pay | Admitting: Urology

## 2015-08-19 ENCOUNTER — Observation Stay
Admission: EM | Admit: 2015-08-19 | Discharge: 2015-08-20 | Discharge status: Against Medical Advice | Payer: Medicare Other | Attending: Internal Medicine | Admitting: Internal Medicine

## 2015-08-19 ENCOUNTER — Emergency Department: Payer: Medicare Other

## 2015-08-19 DIAGNOSIS — G459 Transient cerebral ischemic attack, unspecified: Secondary | ICD-10-CM | POA: Diagnosis not present

## 2015-08-19 DIAGNOSIS — F419 Anxiety disorder, unspecified: Secondary | ICD-10-CM | POA: Insufficient documentation

## 2015-08-19 DIAGNOSIS — R079 Chest pain, unspecified: Secondary | ICD-10-CM | POA: Diagnosis present

## 2015-08-19 DIAGNOSIS — Z79899 Other long term (current) drug therapy: Secondary | ICD-10-CM | POA: Insufficient documentation

## 2015-08-19 DIAGNOSIS — E785 Hyperlipidemia, unspecified: Secondary | ICD-10-CM | POA: Diagnosis not present

## 2015-08-19 DIAGNOSIS — Z7982 Long term (current) use of aspirin: Secondary | ICD-10-CM | POA: Insufficient documentation

## 2015-08-19 DIAGNOSIS — I129 Hypertensive chronic kidney disease with stage 1 through stage 4 chronic kidney disease, or unspecified chronic kidney disease: Secondary | ICD-10-CM | POA: Insufficient documentation

## 2015-08-19 DIAGNOSIS — M1990 Unspecified osteoarthritis, unspecified site: Secondary | ICD-10-CM | POA: Diagnosis not present

## 2015-08-19 DIAGNOSIS — R531 Weakness: Secondary | ICD-10-CM | POA: Diagnosis not present

## 2015-08-19 DIAGNOSIS — N189 Chronic kidney disease, unspecified: Secondary | ICD-10-CM | POA: Diagnosis not present

## 2015-08-19 DIAGNOSIS — E1140 Type 2 diabetes mellitus with diabetic neuropathy, unspecified: Secondary | ICD-10-CM | POA: Insufficient documentation

## 2015-08-19 DIAGNOSIS — H5442 Blindness, left eye, normal vision right eye: Secondary | ICD-10-CM | POA: Insufficient documentation

## 2015-08-19 DIAGNOSIS — F17200 Nicotine dependence, unspecified, uncomplicated: Secondary | ICD-10-CM | POA: Diagnosis not present

## 2015-08-19 DIAGNOSIS — J449 Chronic obstructive pulmonary disease, unspecified: Secondary | ICD-10-CM | POA: Insufficient documentation

## 2015-08-19 DIAGNOSIS — I639 Cerebral infarction, unspecified: Secondary | ICD-10-CM

## 2015-08-19 LAB — URINALYSIS COMPLETE WITH MICROSCOPIC (ARMC ONLY)
BACTERIA UA: NONE SEEN
Bilirubin Urine: NEGATIVE
Glucose, UA: 50 mg/dL — AB
Hgb urine dipstick: NEGATIVE
KETONES UR: NEGATIVE mg/dL
Leukocytes, UA: NEGATIVE
NITRITE: NEGATIVE
PH: 6 (ref 5.0–8.0)
PROTEIN: NEGATIVE mg/dL
SPECIFIC GRAVITY, URINE: 1.018 (ref 1.005–1.030)

## 2015-08-19 LAB — CBC WITH DIFFERENTIAL/PLATELET
BASOS ABS: 0.2 10*3/uL — AB (ref 0–0.1)
Basophils Relative: 1 %
Eosinophils Absolute: 0.3 10*3/uL (ref 0–0.7)
Eosinophils Relative: 2 %
HEMATOCRIT: 36.8 % (ref 35.0–47.0)
Hemoglobin: 11.6 g/dL — ABNORMAL LOW (ref 12.0–16.0)
LYMPHS PCT: 19 %
Lymphs Abs: 3.1 10*3/uL (ref 1.0–3.6)
MCH: 23.4 pg — ABNORMAL LOW (ref 26.0–34.0)
MCHC: 31.4 g/dL — ABNORMAL LOW (ref 32.0–36.0)
MCV: 74.5 fL — AB (ref 80.0–100.0)
MONO ABS: 1.1 10*3/uL — AB (ref 0.2–0.9)
MONOS PCT: 7 %
NEUTROS ABS: 11.8 10*3/uL — AB (ref 1.4–6.5)
Neutrophils Relative %: 71 %
Platelets: 415 10*3/uL (ref 150–440)
RBC: 4.94 MIL/uL (ref 3.80–5.20)
RDW: 18.3 % — AB (ref 11.5–14.5)
WBC: 16.6 10*3/uL — ABNORMAL HIGH (ref 3.6–11.0)

## 2015-08-19 LAB — COMPREHENSIVE METABOLIC PANEL
ALK PHOS: 111 U/L (ref 38–126)
ALT: 16 U/L (ref 14–54)
AST: 15 U/L (ref 15–41)
Albumin: 2.8 g/dL — ABNORMAL LOW (ref 3.5–5.0)
Anion gap: 5 (ref 5–15)
BILIRUBIN TOTAL: 0.5 mg/dL (ref 0.3–1.2)
BUN: 15 mg/dL (ref 6–20)
CALCIUM: 9.1 mg/dL (ref 8.9–10.3)
CHLORIDE: 105 mmol/L (ref 101–111)
CO2: 23 mmol/L (ref 22–32)
CREATININE: 1 mg/dL (ref 0.44–1.00)
GFR calc Af Amer: >60 mL/min (ref >60)
Glucose, Bld: 157 mg/dL — ABNORMAL HIGH (ref 65–99)
Potassium: 3.7 mmol/L (ref 3.5–5.1)
Sodium: 133 mmol/L — ABNORMAL LOW (ref 135–145)
Total Protein: 7.6 g/dL (ref 6.5–8.1)

## 2015-08-19 LAB — APTT: APTT: 37 s — AB (ref 24–36)

## 2015-08-19 LAB — PROTIME-INR
INR: 1.06
PROTHROMBIN TIME: 14 s (ref 11.4–15.0)

## 2015-08-19 LAB — TROPONIN I: Troponin I: <0.03 ng/mL (ref <0.031)

## 2015-08-19 MED ORDER — SODIUM CHLORIDE 0.9 % IJ SOLN: 3.0000 mL | Freq: Two times a day (BID) | INTRAMUSCULAR | Status: DC

## 2015-08-19 MED ORDER — HEPARIN SODIUM (PORCINE) 5000 UNIT/ML IJ SOLN: 5000.0000 [IU] | Freq: Three times a day (TID) | INTRAMUSCULAR | Status: DC

## 2015-08-19 MED ORDER — ACETAMINOPHEN 650 MG RE SUPP: 650.0000 mg | Freq: Four times a day (QID) | RECTAL | Status: DC | PRN

## 2015-08-19 MED ORDER — ONDANSETRON HCL 4 MG PO TABS: 4.0000 mg | ORAL_TABLET | Freq: Four times a day (QID) | ORAL | Status: DC | PRN

## 2015-08-19 MED ORDER — ONDANSETRON HCL 4 MG/2ML IJ SOLN: 4.0000 mg | Freq: Four times a day (QID) | INTRAMUSCULAR | Status: DC | PRN

## 2015-08-19 MED ORDER — SODIUM CHLORIDE 0.9 % IV BOLUS (SEPSIS)
1000.0000 mL | Freq: Once | INTRAVENOUS | Status: AC
  Administered 2015-08-19: 1000 mL via INTRAVENOUS
  Filled 2015-08-19: qty 1000

## 2015-08-19 MED ORDER — LORAZEPAM 2 MG/ML IJ SOLN
2.0000 mg | Freq: Once | INTRAMUSCULAR | Status: DC
  Filled 2015-08-19: qty 1

## 2015-08-19 MED ORDER — ASPIRIN 81 MG PO CHEW: 324.0000 mg | CHEWABLE_TABLET | Freq: Once | ORAL | Status: DC

## 2015-08-19 MED ORDER — STROKE: EARLY STAGES OF RECOVERY BOOK: Freq: Once | Status: DC

## 2015-08-19 MED ORDER — ACETAMINOPHEN 325 MG PO TABS: 650.0000 mg | ORAL_TABLET | Freq: Four times a day (QID) | ORAL | Status: DC | PRN

## 2015-08-19 MED ORDER — MORPHINE SULFATE (PF) 2 MG/ML IV SOLN: 2.0000 mg | INTRAVENOUS | Status: DC | PRN

## 2015-08-19 MED ORDER — OXYCODONE HCL 5 MG PO TABS: 5.0000 mg | ORAL_TABLET | ORAL | Status: DC | PRN

## 2015-08-19 MED ORDER — ASPIRIN EC 81 MG PO TBEC: 81.0000 mg | DELAYED_RELEASE_TABLET | Freq: Every day | ORAL | Status: DC

## 2015-08-19 MED ORDER — CIPROFLOXACIN HCL 500 MG PO TABS
500.0000 mg | ORAL_TABLET | Freq: Once | ORAL | Status: AC
Start: 2015-08-19 — End: 2015-08-19
  Administered 2015-08-19: 500 mg via ORAL
  Filled 2015-08-19: qty 1

## 2015-08-19 NOTE — ED Provider Notes (Addendum)
[facility] Emergency Department Provider Note  ____________________________________________  Time seen: Approximately 8:26 PM  I have reviewed the triage vital signs and the nursing notes.   HISTORY  Chief Complaint Weakness and Stroke Symptoms    HPI Denise Bass is a 44 y.o. 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 Patient reports that she went to sleep in her usual state of health at 2 PM and awoke at 4 PM with poor movement of the left upper extremity and left lower extremity. She reports she feels "like I'm having mini strokes again". She has had trouble ambulating today. She reports decreased sensation in the left upper extremity and left lower extremity. She also has squeezing pain in the left chest wall which feels similar to a pulled muscle and is worse with movement. No other modifying factors. No recent illness including no cough, sneezing, runny nose, congestion, vomiting, diarrhea, fevers or chills. Currently her symptoms are moderate to severe and she is requiring more assistance from her companion to move around the house.   Past Medical History  Diagnosis Date  . Diabetes mellitus without complication (HCC)   . Anxiety   . Arthritis   . COPD (chronic obstructive pulmonary disease) (HCC)   . Emphysema of lung (HCC)   . GERD (gastroesophageal reflux disease)   . Hypertension   . 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 Neuromuscular disorder (HCC)   . Osteoarthritis of both knees   . AC (acromioclavicular) joint bone spurs     knees  . Stroke (HCC)   . Substance abuse   . Genetic blood cell disorder     Rare/unknown/still  researching at [place]/  . Polyclonal gammopathy   . Elevated sed rate   . Leukocytosis   . Idiopathic neuropathy   . HLD (hyperlipidemia)   . Folliculitis   . Lumbago   . Sensory urge incontinence     Patient Active Problem List   Diagnosis Date Noted  . TIA (transient ischemic attack) 08/19/2015  . Acute on chronic kidney failure (HCC) 06/14/2015  . COPD (chronic obstructive pulmonary disease) (HCC) 03/05/2013  . Diabetes mellitus (HCC) 03/05/2013  . Nephropathy 03/05/2013  . Diabetic neuropathy (HCC) 03/05/2013  . CVA (cerebral infarction) 03/05/2013  . Essential hypertension, benign 03/05/2013  . Anxiety 03/05/2013  . Arthritis 03/05/2013  . GERD (gastroesophageal reflux disease) 03/05/2013  . 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 Tobacco use disorder, continuous 03/05/2013  . Marijuana smoker (HCC) 03/05/2013  . Menorrhagia 03/05/2013  . Obesity, Class III, BMI 40-49.9 (morbid obesity) (HCC) 03/05/2013  . History of PCOS 03/05/2013  . Dysmenorrhea 03/05/2013    Past Surgical History  Procedure Laterality Date  . Cholecystectomy    . Dilation and curettage of uterus    . Tonsilectomy, adenoidectomy, bilateral myringotomy and tubes    . Corneal transplant      Current Outpatient Rx  Name  Route  Sig  Dispense  Refill  . albuterol (PROVENTIL HFA;VENTOLIN HFA) 108 (90 BASE) MCG/ACT inhaler   Inhalation   Inhale 1-2 puffs into the lungs every 6 (six) hours as needed for wheezing or shortness of breath.         .Marland Kitchen  aspirin EC 81 MG tablet   Oral   Take 81 mg by mouth daily.         . clonazePAM (KLONOPIN) 0.5 MG tablet   Oral   Take 0.5 mg by mouth 2 (two) times daily as needed for anxiety.         . cyclobenzaprine (FLEXERIL) 5 MG tablet   Oral   Take 5 mg by mouth at bedtime.         . DULoxetine (CYMBALTA) 60 MG capsule   Oral   Take 60 mg by mouth daily.          Marland Kitchen lisinopril  (PRINIVIL,ZESTRIL) 20 MG tablet   Oral   Take 20 mg by mouth daily.          . metoprolol tartrate (LOPRESSOR) 25 MG tablet   Oral   Take 25 mg by mouth 2 (two) times daily.         . sitaGLIPtin (JANUVIA) 100 MG tablet   Oral   Take 100 mg by mouth daily.           Allergies Chantix; Latex; and Penicillins  Family History  Problem Relation Age of Onset  . Stroke Mother   . COPD Mother   . Emphysema Mother   . Asthma Mother   . Liver disease Father   . Stroke Father     cerebral hemrhage  . Tuberculosis Father   . Mental illness Father   . Mental illness Brother   . Heart disease Father     Social History Social History  Substance Use Topics  . Smoking status: Current Every Day Smoker -- 2.00 packs/day for 29 years    Types: Cigarettes  . Smokeless tobacco: Never Used     Comment: usine nicotine patches  . Alcohol Use: No    Review of Systems Constitutional: No fever/chills Eyes: No visual changes. ENT: No sore throat. Cardiovascular: +chest pain. Respiratory: Denies shortness of breath. Gastrointestinal: No abdominal pain.  No nausea, no vomiting.  No diarrhea.  No constipation. Genitourinary: Negative for dysuria. Musculoskeletal: Negative for back pain. Skin: Negative for rash. Neurological: Negative for headaches,  Positive for left arm weakness and numbness.  10-point ROS otherwise negative.  ____________________________________________   PHYSICAL EXAM:  VITAL SIGNS: ED Triage Vitals  Enc Vitals Group     BP 08/19/15 1954 111/68 mmHg     Pulse Rate 08/19/15 1954 90     Resp 08/19/15 1954 17     Temp 08/19/15 1954 98.2 F (36.8 C)     Temp Source 08/19/15 1954 Oral     SpO2 08/19/15 1954 98 %     Weight 08/19/15 1954 211 lb (95.709 kg)     Height 08/19/15 1954  (1.651 m)     Head Cir --      Peak Flow --      Pain Score 08/19/15 1955 10     Pain Loc --      Pain Edu? --      Excl. in GC? --     Constitutional: Alert and  oriented. Nontoxic appearing and in no acute distress. Eyes: Conjunctivae are normal. Chronically clouded left cornea Head: Atraumatic. Nose: No congestion/rhinnorhea. Mouth/Throat: Mucous membranes are moist.  Oropharynx non-erythematous. Neck: No stridor.   Cardiovascular: Normal rate, regular rhythm. Grossly normal heart sounds.  Good peripheral circulation. Respiratory: Normal respiratory effort.  No retractions. Lungs CTAB. Gastrointestinal: Soft and nontender. No distention. No abdominal bruits. No CVA  tenderness. Genitourinary: deferred Musculoskeletal: No lower extremity tenderness nor edema.  No joint effusions. Mild tenderness to palpation throughout the chest wall. Neurologic:  Normal speech and language. She moves the left upper extremity spontaneously but only has 3 out of 5 grip strength and has decreased sensation in the left upper extremity. She reports that she is unable to move the left lower extremity and refuses to do so with much prompting however she is able to stand and ambulate with heavy assistance. She has 4 out of 5 strength in the right lower extremity. 5 out of 5 strength in the right upper extremity. Decreased sensation to light touch throughout the left upper cavity in the left lower extremity. Cranial nerves II through XII intact. Skin:  Skin is warm, dry and intact. No rash noted. Psychiatric: Mood and affect are normal. Speech and behavior are normal.  ____________________________________________   LABS (all labs ordered are listed, but only abnormal results are displayed)  Labs Reviewed  CBC WITH DIFFERENTIAL/PLATELET - Abnormal; Notable for the following:    WBC 16.6 (*)    Hemoglobin 11.6 (*)    MCV 74.5 (*)    MCH 23.4 (*)    MCHC 31.4 (*)    RDW 18.3 (*)    Neutro Abs 11.8 (*)    Monocytes Absolute 1.1 (*)    Basophils Absolute 0.2 (*)    All other components within normal limits  COMPREHENSIVE METABOLIC PANEL - Abnormal; Notable for the  following:    Sodium 133 (*)    Glucose, Bld 157 (*)    Albumin 2.8 (*)    All other components within normal limits  APTT - Abnormal; Notable for the following:    aPTT 37 (*)    All other components within normal limits  URINALYSIS COMPLETEWITH MICROSCOPIC ([facility] ONLY) - Abnormal; Notable for the following:    Color, Urine YELLOW (*)    APPearance CLEAR (*)    Glucose, UA 50 (*)    Squamous Epithelial / LPF 0-5 (*)    All other components within normal limits  PROTIME-INR  TROPONIN I   ____________________________________________  EKG  ED ECG REPORT I, Gayla Doss, the attending physician, personally viewed and interpreted this ECG.   Date: 08/19/2015  EKG Time: 20:39  Rate: 86  Rhythm: normal sinus rhythm  Axis: rightward  Intervals:none  ST&T Change: No acute ST elevation. Q waves in lead 2, 3, aVF.  ____________________________________________  RADIOLOGY  CT head  IMPRESSION: No acute intracranial abnormalities.   CXR  IMPRESSION: No active cardiopulmonary disease. ____________________________________________   PROCEDURES  Procedure(s) performed: None  Critical Care performed: Yes, see critical care note(s). Total critical care time spent 30 minutes.  ____________________________________________   INITIAL IMPRESSION / ASSESSMENT AND PLAN / ED COURSE  Pertinent labs & imaging results that were available during my care of the patient were reviewed by me and considered in my medical decision making (see chart for details).  Aarya REID NAWROT is a 44 y.o. 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 She is not a candidate for TPA given that her symptoms were noted on  awakening. Her story is in constant flux. She told the triage nurse that she has been having difficulty with gripping of the left hand for  nearly 2 months however reported to me that her symptoms were noted upon awakening at 4 PM. Additionally, she has an inconsistent neurological examination but possibly does have some weakness on the left side. The concern is for possible acute CVA. She certainly has multiple risk factors for cerebrovascular disease. Plan for screening labs, CT head, chest x-ray and likely admission.  ----------------------------------------- 10:58 PM on 08/19/2015 ----------------------------------------- Labs reviewed. CBC notable for chronic leukocytosis. Mild anemia with hemoglobin 11.6. CMP unremarkable. Normal coags. Troponin less than 0.03, suspect pain in chest is musculoskeletal in nature. Urinalysis is not consistent with infection. CT head negative for any acute intracranial process. Chest x-ray shows no acute cardio pulmonary disease. Aspirin ordered. Case discussed with Dr. Clint Guy, hospitalist, for admission at this time.  ----------------------------------------- 11:28 PM on 08/19/2015 ----------------------------------------- Patient reports that she has changed her mind and no longer wants admission. I discussed with her that she is leaving AGAINST MEDICAL ADVICE as I'm concerned that she may have had a repeat stroke this evening. She voices understanding of this and reports "I know that I could've had a stroke, Honey...but I just need to go home and be in my own bed... I'll follow up with my doctor on Monday and ask him for an MRI". I discussed with her the risk of permanent neurological disability, expanding stroke, death, loss of current lifestyle, she voices understanding of these risks and still wishes to go home. She is signed AMA paperwork.  ____________________________________________   FINAL CLINICAL IMPRESSION(S) / ED DIAGNOSES  Final diagnoses:  Cerebral  infarction due to unspecified mechanism      Gayla Doss, MD 08/19/15 2300  Gayla Doss, MD 08/19/15 [phone]

## 2015-08-19 NOTE — ED Notes (Signed)
Pt states "I'm having ministrokes. I feel like something isn't right."  Pt has left sided weakness and tremors.  Pt has hx of diabetes, hypertension, genetic disorder affecting sight and hearing. Pt is blind in left eye, deaf in left ear. Pt unable to grip with left hand, for approximately 2 months. Pt reports her her left sided weakness was worse after her nap at approximately 4pm.

## 2015-08-19 NOTE — ED Notes (Signed)
Patient transported to X-ray 

## 2015-08-19 NOTE — ED Notes (Signed)
Pt refused CT  

## 2015-08-19 NOTE — ED Notes (Signed)
Per pt. request, called her sister @ [phone] to update her.  Her sister is her HCPOA, gave her Noel's [facility] number to return calls.

## 2015-08-19 NOTE — Discharge Instructions (Signed)
Your are opting to leave AGAINST MEDICAL ADVICE after possibly having a stroke today. If you change your mind and desire treatment/further evaluation, please return at any time and we are happy to treat you. See your doctor as soon as possible for additional testing/treatment. Return to the emergency department if you develop severe or worsening symptoms, chest pain, difficulty breathing, vision changes, speech difficulties, fevers, chills, abdominal pain, vomiting, or for any other concerns.

## 2015-08-22 ENCOUNTER — Observation Stay
Admission: EM | Admit: 2015-08-22 | Discharge: 2015-08-25 | Disposition: A | Discharge status: Home / Self Care | Payer: Medicare Other | Attending: Internal Medicine | Admitting: Internal Medicine

## 2015-08-22 DIAGNOSIS — E041 Nontoxic single thyroid nodule: Secondary | ICD-10-CM | POA: Diagnosis not present

## 2015-08-22 DIAGNOSIS — F419 Anxiety disorder, unspecified: Secondary | ICD-10-CM | POA: Insufficient documentation

## 2015-08-22 DIAGNOSIS — N920 Excessive and frequent menstruation with regular cycle: Secondary | ICD-10-CM | POA: Insufficient documentation

## 2015-08-22 DIAGNOSIS — Z7982 Long term (current) use of aspirin: Secondary | ICD-10-CM | POA: Diagnosis not present

## 2015-08-22 DIAGNOSIS — F17210 Nicotine dependence, cigarettes, uncomplicated: Secondary | ICD-10-CM | POA: Insufficient documentation

## 2015-08-22 DIAGNOSIS — Z7951 Long term (current) use of inhaled steroids: Secondary | ICD-10-CM | POA: Insufficient documentation

## 2015-08-22 DIAGNOSIS — E1140 Type 2 diabetes mellitus with diabetic neuropathy, unspecified: Secondary | ICD-10-CM | POA: Diagnosis not present

## 2015-08-22 DIAGNOSIS — Z6841 Body Mass Index (BMI) 40.0 and over, adult: Secondary | ICD-10-CM | POA: Insufficient documentation

## 2015-08-22 DIAGNOSIS — R55 Syncope and collapse: Secondary | ICD-10-CM | POA: Insufficient documentation

## 2015-08-22 DIAGNOSIS — I6523 Occlusion and stenosis of bilateral carotid arteries: Secondary | ICD-10-CM | POA: Insufficient documentation

## 2015-08-22 DIAGNOSIS — Z79899 Other long term (current) drug therapy: Secondary | ICD-10-CM | POA: Diagnosis not present

## 2015-08-22 DIAGNOSIS — N179 Acute kidney failure, unspecified: Secondary | ICD-10-CM | POA: Insufficient documentation

## 2015-08-22 DIAGNOSIS — L739 Follicular disorder, unspecified: Secondary | ICD-10-CM | POA: Diagnosis not present

## 2015-08-22 DIAGNOSIS — Z23 Encounter for immunization: Secondary | ICD-10-CM | POA: Insufficient documentation

## 2015-08-22 DIAGNOSIS — E282 Polycystic ovarian syndrome: Secondary | ICD-10-CM | POA: Insufficient documentation

## 2015-08-22 DIAGNOSIS — Z7984 Long term (current) use of oral hypoglycemic drugs: Secondary | ICD-10-CM | POA: Insufficient documentation

## 2015-08-22 DIAGNOSIS — G609 Hereditary and idiopathic neuropathy, unspecified: Secondary | ICD-10-CM | POA: Insufficient documentation

## 2015-08-22 DIAGNOSIS — E1122 Type 2 diabetes mellitus with diabetic chronic kidney disease: Secondary | ICD-10-CM | POA: Insufficient documentation

## 2015-08-22 DIAGNOSIS — Z880 Allergy status to penicillin: Secondary | ICD-10-CM | POA: Diagnosis not present

## 2015-08-22 DIAGNOSIS — D72829 Elevated white blood cell count, unspecified: Secondary | ICD-10-CM | POA: Insufficient documentation

## 2015-08-22 DIAGNOSIS — H5442 Blindness, left eye, normal vision right eye: Secondary | ICD-10-CM | POA: Insufficient documentation

## 2015-08-22 DIAGNOSIS — N946 Dysmenorrhea, unspecified: Secondary | ICD-10-CM | POA: Insufficient documentation

## 2015-08-22 DIAGNOSIS — R42 Dizziness and giddiness: Secondary | ICD-10-CM | POA: Insufficient documentation

## 2015-08-22 DIAGNOSIS — I639 Cerebral infarction, unspecified: Secondary | ICD-10-CM | POA: Diagnosis not present

## 2015-08-22 DIAGNOSIS — R29810 Facial weakness: Secondary | ICD-10-CM | POA: Diagnosis not present

## 2015-08-22 DIAGNOSIS — I129 Hypertensive chronic kidney disease with stage 1 through stage 4 chronic kidney disease, or unspecified chronic kidney disease: Secondary | ICD-10-CM | POA: Diagnosis not present

## 2015-08-22 DIAGNOSIS — Z91040 Latex allergy status: Secondary | ICD-10-CM | POA: Insufficient documentation

## 2015-08-22 DIAGNOSIS — Z888 Allergy status to other drugs, medicaments and biological substances status: Secondary | ICD-10-CM | POA: Diagnosis not present

## 2015-08-22 DIAGNOSIS — R079 Chest pain, unspecified: Secondary | ICD-10-CM | POA: Insufficient documentation

## 2015-08-22 DIAGNOSIS — N189 Chronic kidney disease, unspecified: Secondary | ICD-10-CM | POA: Diagnosis not present

## 2015-08-22 DIAGNOSIS — K219 Gastro-esophageal reflux disease without esophagitis: Secondary | ICD-10-CM | POA: Insufficient documentation

## 2015-08-22 DIAGNOSIS — Z8673 Personal history of transient ischemic attack (TIA), and cerebral infarction without residual deficits: Secondary | ICD-10-CM | POA: Insufficient documentation

## 2015-08-22 DIAGNOSIS — J449 Chronic obstructive pulmonary disease, unspecified: Secondary | ICD-10-CM | POA: Diagnosis not present

## 2015-08-22 DIAGNOSIS — J320 Chronic maxillary sinusitis: Secondary | ICD-10-CM | POA: Insufficient documentation

## 2015-08-22 DIAGNOSIS — E785 Hyperlipidemia, unspecified: Secondary | ICD-10-CM | POA: Insufficient documentation

## 2015-08-22 DIAGNOSIS — G8194 Hemiplegia, unspecified affecting left nondominant side: Secondary | ICD-10-CM | POA: Insufficient documentation

## 2015-08-22 DIAGNOSIS — I6529 Occlusion and stenosis of unspecified carotid artery: Secondary | ICD-10-CM

## 2015-08-22 LAB — CBC
HEMATOCRIT: 39.5 % (ref 35.0–47.0)
HEMOGLOBIN: 12.7 g/dL (ref 12.0–16.0)
MCH: 24.2 pg — AB (ref 26.0–34.0)
MCHC: 32.1 g/dL (ref 32.0–36.0)
MCV: 75.4 fL — ABNORMAL LOW (ref 80.0–100.0)
Platelets: 432 10*3/uL (ref 150–440)
RBC: 5.23 MIL/uL — ABNORMAL HIGH (ref 3.80–5.20)
RDW: 17.6 % — ABNORMAL HIGH (ref 11.5–14.5)
WBC: 12.8 10*3/uL — ABNORMAL HIGH (ref 3.6–11.0)

## 2015-08-22 LAB — URINALYSIS COMPLETE WITH MICROSCOPIC (ARMC ONLY)
BILIRUBIN URINE: NEGATIVE
Bacteria, UA: NONE SEEN
Glucose, UA: 50 mg/dL — AB
Hgb urine dipstick: NEGATIVE
KETONES UR: NEGATIVE mg/dL
Leukocytes, UA: NEGATIVE
Nitrite: NEGATIVE
Protein, ur: NEGATIVE mg/dL
Specific Gravity, Urine: 1.021 (ref 1.005–1.030)
WBC, UA: NONE SEEN WBC/hpf (ref 0–5)
pH: 6 (ref 5.0–8.0)

## 2015-08-22 LAB — LIPID PANEL
CHOL/HDL RATIO: 5.2 ratio
Cholesterol: 141 mg/dL (ref 0–200)
HDL: 27 mg/dL — AB (ref >40)
LDL CALC: 89 mg/dL (ref 0–99)
TRIGLYCERIDES: 123 mg/dL (ref <150)
VLDL: 25 mg/dL (ref 0–40)

## 2015-08-22 LAB — BASIC METABOLIC PANEL
Anion gap: 7 (ref 5–15)
BUN: 8 mg/dL (ref 6–20)
CO2: 27 mmol/L (ref 22–32)
CREATININE: 0.71 mg/dL (ref 0.44–1.00)
Calcium: 9.2 mg/dL (ref 8.9–10.3)
Chloride: 103 mmol/L (ref 101–111)
GFR calc Af Amer: >60 mL/min (ref >60)
Glucose, Bld: 142 mg/dL — ABNORMAL HIGH (ref 65–99)
POTASSIUM: 3.9 mmol/L (ref 3.5–5.1)
SODIUM: 137 mmol/L (ref 135–145)

## 2015-08-22 MED ORDER — INFLUENZA VAC SPLIT QUAD 0.5 ML IM SUSY
0.5000 mL | PREFILLED_SYRINGE | INTRAMUSCULAR | Status: AC
  Administered 2015-08-23: 13:00:00 0.5 mL via INTRAMUSCULAR
  Filled 2015-08-22: qty 0.5

## 2015-08-22 MED ORDER — ONDANSETRON HCL 4 MG/2ML IJ SOLN: 4.0000 mg | Freq: Four times a day (QID) | INTRAMUSCULAR | Status: DC | PRN

## 2015-08-22 MED ORDER — ATORVASTATIN CALCIUM 20 MG PO TABS
40.0000 mg | ORAL_TABLET | Freq: Every day | ORAL | Status: DC
  Administered 2015-08-23 – 2015-08-24 (×2): 40 mg via ORAL
  Filled 2015-08-22 (×2): qty 2

## 2015-08-22 MED ORDER — MORPHINE SULFATE (PF) 2 MG/ML IV SOLN
2.0000 mg | INTRAVENOUS | Status: DC | PRN
  Administered 2015-08-23 – 2015-08-25 (×3): 2 mg via INTRAVENOUS
  Filled 2015-08-22 (×4): qty 1

## 2015-08-22 MED ORDER — HEPARIN SODIUM (PORCINE) 5000 UNIT/ML IJ SOLN
5000.0000 [IU] | Freq: Three times a day (TID) | INTRAMUSCULAR | Status: DC
  Administered 2015-08-23: 01:00:00 5000 [IU] via SUBCUTANEOUS
  Filled 2015-08-22: qty 1

## 2015-08-22 MED ORDER — OXYCODONE HCL 5 MG PO TABS
5.0000 mg | ORAL_TABLET | ORAL | Status: DC | PRN
  Administered 2015-08-22 – 2015-08-25 (×6): 5 mg via ORAL
  Filled 2015-08-22 (×6): qty 1

## 2015-08-22 MED ORDER — ACETAMINOPHEN 325 MG PO TABS
650.0000 mg | ORAL_TABLET | Freq: Four times a day (QID) | ORAL | Status: DC | PRN
  Administered 2015-08-23: 650 mg via ORAL
  Filled 2015-08-22: qty 2

## 2015-08-22 MED ORDER — ACETAMINOPHEN 650 MG RE SUPP: 650.0000 mg | Freq: Four times a day (QID) | RECTAL | Status: DC | PRN

## 2015-08-22 MED ORDER — SODIUM CHLORIDE 0.9 % IV SOLN
INTRAVENOUS | Status: DC
  Administered 2015-08-23 – 2015-08-24 (×3): via INTRAVENOUS

## 2015-08-22 MED ORDER — ASPIRIN EC 81 MG PO TBEC
81.0000 mg | DELAYED_RELEASE_TABLET | Freq: Every day | ORAL | Status: DC
  Administered 2015-08-22: 81 mg via ORAL
  Filled 2015-08-22: qty 1

## 2015-08-22 MED ORDER — ONDANSETRON HCL 4 MG PO TABS: 4.0000 mg | ORAL_TABLET | Freq: Four times a day (QID) | ORAL | Status: DC | PRN

## 2015-08-22 MED ORDER — SODIUM CHLORIDE 0.9 % IJ SOLN
3.0000 mL | Freq: Two times a day (BID) | INTRAMUSCULAR | Status: DC
  Administered 2015-08-23 – 2015-08-25 (×4): 3 mL via INTRAVENOUS

## 2015-08-22 NOTE — ED Notes (Signed)
Pts family called to ask why pt had not been updated. Pt has been updated that MD is waiting on last blood test and family updated on this as well.

## 2015-08-22 NOTE — ED Notes (Signed)
Lab called again about CBC and reported the sample was clotted and they had let RN in ED know a redraw was needed.

## 2015-08-22 NOTE — ED Provider Notes (Signed)
[facility] Emergency Department Provider Note  ____________________________________________  Time seen: 3:45 PM  I have reviewed the triage vital signs and the nursing notes.   HISTORY  Chief Complaint Weakness    HPI Denise Bass is a 44 y.o. female who presents with left-sided weakness apparently for some time perhaps it is become worse in the last few days. Patient reports that she was here on the 21st and was to be admitted but decided to leave AMA because [place] ". She states she is ready to be admitted now because her left side keeps giving out on her. She denies headache she does report a history of a CVA in the past. She has diabetes.      Past Medical History  Diagnosis Date  . Diabetes mellitus without complication (HCC)   . Anxiety   . Arthritis   . COPD (chronic obstructive pulmonary disease) (HCC)   . Emphysema of lung (HCC)   . GERD (gastroesophageal reflux disease)   . Hypertension   . 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 Neuromuscular disorder (HCC)   . Osteoarthritis of both knees   . AC (acromioclavicular) joint bone spurs     knees  . Stroke (HCC)   . Substance abuse   . Genetic blood cell disorder     Rare/unknown/still researching at [place]/  . Polyclonal gammopathy   . Elevated sed rate   . Leukocytosis   . Idiopathic neuropathy   . HLD (hyperlipidemia)   . Folliculitis   . Lumbago   . Sensory urge incontinence     Patient Active Problem List   Diagnosis Date Noted  . TIA (transient ischemic attack) 08/19/2015  . Acute on chronic kidney failure (HCC) 06/14/2015  . COPD (chronic obstructive pulmonary disease) (HCC) 03/05/2013  . Diabetes mellitus (HCC) 03/05/2013  . Nephropathy 03/05/2013  . Diabetic neuropathy (HCC) 03/05/2013  . CVA (cerebral infarction) 03/05/2013  . Essential hypertension, benign 03/05/2013  . Anxiety 03/05/2013  . Arthritis  03/05/2013  . GERD (gastroesophageal reflux disease) 03/05/2013  . 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 Tobacco use disorder, continuous 03/05/2013  . Marijuana smoker (HCC) 03/05/2013  . Menorrhagia 03/05/2013  . Obesity, Class III, BMI 40-49.9 (morbid obesity) (HCC) 03/05/2013  . History of PCOS 03/05/2013  . Dysmenorrhea 03/05/2013    Past Surgical History  Procedure Laterality Date  . Cholecystectomy    . Dilation and curettage of uterus    . Tonsilectomy, adenoidectomy, bilateral myringotomy and tubes    . Corneal transplant      Current Outpatient Rx  Name  Route  Sig  Dispense  Refill  . albuterol (PROVENTIL HFA;VENTOLIN HFA) 108 (90 BASE) MCG/ACT inhaler   Inhalation   Inhale 1-2 puffs into the lungs every 6 (six) hours as needed for wheezing or shortness of breath.         Marland Kitchen aspirin EC 81 MG tablet   Oral   Take 81 mg by mouth daily.         . clonazePAM (KLONOPIN) 0.5 MG tablet   Oral   Take 0.5 mg by mouth 2 (two) times daily as needed for anxiety.         . cyclobenzaprine (FLEXERIL) 5 MG tablet   Oral   Take 5 mg by mouth at bedtime.         Marland Kitchen  DULoxetine (CYMBALTA) 60 MG capsule   Oral   Take 60 mg by mouth daily.          Marland Kitchen lisinopril (PRINIVIL,ZESTRIL) 20 MG tablet   Oral   Take 20 mg by mouth daily.          . metoprolol tartrate (LOPRESSOR) 25 MG tablet   Oral   Take 25 mg by mouth 2 (two) times daily.         . sitaGLIPtin (JANUVIA) 100 MG tablet   Oral   Take 100 mg by mouth daily.           Allergies Chantix; Latex; and Penicillins  Family History  Problem Relation Age of Onset  . Stroke Mother   . COPD Mother   . Emphysema Mother   . Asthma Mother   . Liver disease Father   . Stroke Father     cerebral hemrhage  . Tuberculosis Father   . Mental illness Father   . Mental illness Brother   . Heart disease Father     Social History Social History   Substance Use Topics  . Smoking status: Current Every Day Smoker -- 2.00 packs/day for 29 years    Types: Cigarettes  . Smokeless tobacco: Never Used     Comment: usine nicotine patches  . Alcohol Use: No    Review of Systems  Constitutional: Negative for fever. Eyes: Negative for visual changes. ENT: Negative for sore throat Cardiovascular: Negative for chest pain. Respiratory: Negative for shortness of breath. Gastrointestinal: Negative for abdominal pain, vomiting and diarrhea. Genitourinary: Negative for dysuria. Musculoskeletal: Negative for back pain. Skin: Negative for rash. Neurological: Negative for headache Psychiatric: Anxiety    ____________________________________________   PHYSICAL EXAM:  VITAL SIGNS: ED Triage Vitals  Enc Vitals Group     BP 08/22/15 1544 160/93 mmHg     Pulse Rate 08/22/15 1544 88     Resp 08/22/15 1544 16     Temp 08/22/15 1544 97.7 F (36.5 C)     Temp Source 08/22/15 1544 Oral     SpO2 08/22/15 1544 96 %     Weight 08/22/15 1544 211 lb (95.709 kg)     Height 08/22/15 1544  (1.651 m)     Head Cir --      Peak Flow --      Pain Score 08/22/15 1544 5     Pain Loc --      Pain Edu? --      Excl. in GC? --     Constitutional: Alert and oriented. Well appearing and in no distress. Eyes: Conjunctivae are normal. Abnormal iris on left ENT   Head: Normocephalic and atraumatic.   Mouth/Throat: Mucous membranes are moist. Cardiovascular: Normal rate, regular rhythm. Normal and symmetric distal pulses are present in all extremities. No murmurs, rubs, or gallops. Respiratory: Normal respiratory effort without tachypnea nor retractions. Breath sounds are clear and equal bilaterally.  Gastrointestinal: Soft and non-tender in all quadrants. No distention. There is no CVA tenderness. Genitourinary: deferred Musculoskeletal: . No lower extremity tenderness nor edema. Neurologic:  Normal speech and language. Patient has  decreased grip strength on the left and is unable to lift her left leg off the bed. This is apparently the case for some time but she states it is become worse recently Skin:  Skin is warm, dry and intact. No rash noted. Psychiatric: Patient with mild anxiety  ____________________________________________    LABS (pertinent positives/negatives)  Labs Reviewed  BASIC  METABOLIC PANEL - Abnormal; Notable for the following:    Glucose, Bld 142 (*)    All other components within normal limits  URINALYSIS COMPLETEWITH MICROSCOPIC ([facility] ONLY) - Abnormal; Notable for the following:    Color, Urine YELLOW (*)    APPearance CLEAR (*)    Glucose, UA 50 (*)    Squamous Epithelial / LPF 0-5 (*)    All other components within normal limits  CBC  CBC    ____________________________________________   EKG  ED ECG REPORT I, Jene EveryKINNER, Lamel Mccarley, the attending physician, personally viewed and interpreted this ECG.  Date: 08/22/2015 EKG Time: 4:03 PM Rate: 84 Rhythm: normal sinus rhythm QRS Axis: normal Intervals: normal ST/T Wave abnormalities: normal Conduction Disutrbances: none Narrative Interpretation: unremarkable   ____________________________________________    RADIOLOGY I have personally reviewed any xrays that were ordered on this patient: CT head from the 21st showed no acute abnormalities  ____________________________________________   PROCEDURES  Procedure(s) performed: none  Critical Care performed: none  ____________________________________________   INITIAL IMPRESSION / ASSESSMENT AND PLAN / ED COURSE  Pertinent labs & imaging results that were available during my care of the patient were reviewed by me and considered in my medical decision making (see chart for details).  Complex situation. It is difficult to obtain a clear history from the patient as it appears to change significantly between myself and the nurses. She does appear to have some left-sided  weakness which was documented on the 21st as well. I will discuss with the hospitalists for possible admission  ____________________________________________   FINAL CLINICAL IMPRESSION(S) / ED DIAGNOSES  Final diagnoses:  Cerebral infarction due to unspecified mechanism     Jene Everyobert Drenda Sobecki, MD 08/22/15 2030

## 2015-08-22 NOTE — ED Notes (Deleted)
Pt stopped drinking on Thursday and has been experiencing hallucinations as well as shaking and cramping for the last 2 days. Pt sees people and things that are not there. Pt alert and fidgety during triage.

## 2015-08-22 NOTE — ED Notes (Addendum)
Lab called and updated about CBC being added back to lab tests but RN waited 5 minutes and no one answered phone. Will call back shortly to try again.

## 2015-08-22 NOTE — ED Notes (Signed)
Admitting MD at bedside.

## 2015-08-22 NOTE — H&P (Signed)
[facility] - [place] at [facility]   PATIENT NAME: Denise Bass    MR#:  [id number]  DATE OF BIRTH:  May 31, 1971   DATE OF ADMISSION:  08/22/2015  PRIMARY CARE PHYSICIAN: Jerl Mina, MD   REQUESTING/REFERRING PHYSICIAN: Kinner  CHIEF COMPLAINT:   Chief Complaint  Patient presents with  . Weakness    HISTORY OF PRESENT ILLNESS:  Denise Bass  is a 44 y.o. female with a known history of type 2 diabetes non-insulin-requiring, essential hypertension is presenting with left-sided weakness. She actually presented on 08/19/2015 with acute onset left-sided weakness however she refused admission at that time and subsequently left AGAINST MEDICAL ADVICE. She returns today given persistence of symptoms as well as somewhat worsening. She states now she is unable to and plan given left-sided weakness. She describes left upper and lower extremity weakness with intermittent left-sided facial droop with garbled speech. Currently the speech has improved back to baseline however the left side remains weak.   PAST MEDICAL HISTORY:   Past Medical History  Diagnosis Date  . Diabetes mellitus without complication (HCC)   . Anxiety   . Arthritis   . COPD (chronic obstructive pulmonary disease) (HCC)   . Emphysema of lung (HCC)   . GERD (gastroesophageal reflux disease)   . Hypertension   . 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 Neuromuscular disorder (HCC)   . Osteoarthritis of both knees   . AC (acromioclavicular) joint bone spurs     knees  . Stroke (HCC)   . Substance abuse   . Genetic blood cell disorder     Rare/unknown/still researching at [place]/  . Polyclonal gammopathy   . Elevated sed rate   . Leukocytosis   . Idiopathic neuropathy   . HLD (hyperlipidemia)   . Folliculitis   . Lumbago   . Sensory urge incontinence     PAST SURGICAL HISTORY:   Past Surgical History  Procedure Laterality Date  .  Cholecystectomy    . Dilation and curettage of uterus    . Tonsilectomy, adenoidectomy, bilateral myringotomy and tubes    . Corneal transplant      SOCIAL HISTORY:   Social History  Substance Use Topics  . Smoking status: Current Every Day Smoker -- 2.00 packs/day for 29 years    Types: Cigarettes  . Smokeless tobacco: Never Used     Comment: usine nicotine patches  . Alcohol Use: No    FAMILY HISTORY:   Family History  Problem Relation Age of Onset  . Stroke Mother   . COPD Mother   . Emphysema Mother   . Asthma Mother   . Liver disease Father   . Stroke Father     cerebral hemrhage  . Tuberculosis Father   . Mental illness Father   . Mental illness Brother   . Heart disease Father     DRUG ALLERGIES:   Allergies  Allergen Reactions  . Chantix [Varenicline] Hives  . Latex Rash and Other (See Comments)    Reaction:  Burning   . Penicillins Hives and Other (See Comments)    Has patient had a PCN reaction causing immediate rash, facial/tongue/throat swelling, SOB or lightheadedness with hypotension: No Has patient had a PCN reaction causing severe rash involving mucus membranes or skin necrosis: No Has patient had a PCN reaction that required hospitalization No Has patient had a PCN reaction occurring within the last 10 years: No  If all of the above answers are "NO", then may proceed with Cephalosporin use.    REVIEW OF SYSTEMS:  REVIEW OF SYSTEMS:  CONSTITUTIONAL: Denies fevers, chills, positive fatigue, weakness.  EYES: Denies blurred vision, double vision, or eye pain.  EARS, NOSE, THROAT: Denies tinnitus, ear pain, hearing loss.  RESPIRATORY: denies cough, shortness of breath, wheezing  CARDIOVASCULAR: Denies chest pain, palpitations, edema.  GASTROINTESTINAL: Denies nausea, vomiting, diarrhea, abdominal pain.  GENITOURINARY: Denies dysuria, hematuria.  ENDOCRINE: Denies nocturia or thyroid problems. HEMATOLOGIC AND LYMPHATIC: Denies easy bruising or  bleeding.  SKIN: Denies rash or lesions.  MUSCULOSKELETAL: Denies pain in neck, back, shoulder, knees, hips, or further arthritic symptoms.  NEUROLOGIC: Denies paralysis, paresthesias. Positive left-sided upper and lower weakness PSYCHIATRIC: Denies anxiety or depressive symptoms. Otherwise full review of systems performed by me is negative.   MEDICATIONS AT HOME:   Prior to Admission medications   Medication Sig Start Date End Date Taking? Authorizing Provider  albuterol (PROVENTIL HFA;VENTOLIN HFA) 108 (90 BASE) MCG/ACT inhaler Inhale 1-2 puffs into the lungs every 6 (six) hours as needed for wheezing or shortness of breath.   Yes Historical Provider, MD  aspirin EC 81 MG tablet Take 81 mg by mouth daily.   Yes Historical Provider, MD  clonazePAM (KLONOPIN) 0.5 MG tablet Take 0.5 mg by mouth 2 (two) times daily as needed for anxiety.   Yes Historical Provider, MD  cyclobenzaprine (FLEXERIL) 5 MG tablet Take 5 mg by mouth at bedtime.   Yes Historical Provider, MD  DULoxetine (CYMBALTA) 60 MG capsule Take 60 mg by mouth daily.    Yes Historical Provider, MD  lisinopril (PRINIVIL,ZESTRIL) 20 MG tablet Take 20 mg by mouth daily.    Yes Historical Provider, MD  Melatonin 2.5 MG CAPS Take 2.5 mg by mouth at bedtime.   Yes Historical Provider, MD  metoprolol tartrate (LOPRESSOR) 25 MG tablet Take 25 mg by mouth 2 (two) times daily.   Yes Historical Provider, MD  OVER THE COUNTER MEDICATION Take 1 tablet by mouth at bedtime. Pt takes Hyland's Restful Legs.   Yes Historical Provider, MD  sitaGLIPtin (JANUVIA) 100 MG tablet Take 100 mg by mouth daily.   Yes Historical Provider, MD      VITAL SIGNS:  Blood pressure 179/93, pulse 87, temperature 97.7 F (36.5 C), temperature source Oral, resp. rate 16, height 5\' 5"  (1.651 m), weight 211 lb (95.709 kg), last menstrual period 11/29/2014, SpO2 99 %.  PHYSICAL EXAMINATION:  VITAL SIGNS: Filed Vitals:   08/22/15 1938  BP: 179/93  Pulse: 87  Temp:    Resp: 16   GENERAL:44 y.o.female currently in no acute distress.  HEAD: Normocephalic, atraumatic.  EYES: Pupils equal, round, reactive to light. Extraocular muscles intact. No scleral icterus.  MOUTH: Moist mucosal membrane. Dentition intact. No abscess noted.  EAR, NOSE, THROAT: Clear without exudates. No external lesions.  NECK: Supple. No thyromegaly. No nodules. No JVD.  PULMONARY: Clear to ascultation, without wheeze rails or rhonci. No use of accessory muscles, Good respiratory effort. good air entry bilaterally CHEST: Nontender to palpation.  CARDIOVASCULAR: S1 and S2. Regular rate and rhythm. No murmurs, rubs, or gallops. No edema. Pedal pulses 2+ bilaterally.  GASTROINTESTINAL: Soft, nontender, nondistended. No masses. Positive bowel sounds. No hepatosplenomegaly.  MUSCULOSKELETAL: No swelling, clubbing, or edema. Range of motion full in all extremities.  NEUROLOGIC: Cranial nerves II through XII are intact. Pronator drift within normal limits, strength 5/5 in right upper and lower extremity proximal/ distal flexion and  extension strength 3/5 left hand grip, 3/5 proximal left lower extremity flexion, 4/5 distal left lower extremity flexion/extension, gait deferred at this time SKIN: No ulceration, lesions, rashes, or cyanosis. Skin warm and dry. Turgor intact.  PSYCHIATRIC: Mood, affect within normal limits. The patient is awake, alert and oriented x 3. Insight, judgment intact.    LABORATORY PANEL:   CBC  Recent Labs Lab 08/22/15 1935  WBC 12.8*  HGB 12.7  HCT 39.5  PLT 432   ------------------------------------------------------------------------------------------------------------------  Chemistries   Recent Labs Lab 08/19/15 2125 08/22/15 1610  NA 133* 137  K 3.7 3.9  CL 105 103  CO2 23 27  GLUCOSE 157* 142*  BUN 15 8  CREATININE 1.00 0.71  CALCIUM 9.1 9.2  AST 15  --   ALT 16  --   ALKPHOS 111  --   BILITOT 0.5  --     ------------------------------------------------------------------------------------------------------------------  Cardiac Enzymes  Recent Labs Lab 08/19/15 2125  TROPONINI <0.03   ------------------------------------------------------------------------------------------------------------------  RADIOLOGY:  No results found.  EKG:   Orders placed or performed during the hospital encounter of 08/22/15  . ED EKG  . ED EKG  . EKG 12-Lead  . EKG 12-Lead    IMPRESSION AND PLAN:   44 year old Caucasian female history of type 2 diabetes non-insulin-requiring complicated by neuropathy who is presenting with left-sided weakness.  1. CVA, unspecified: Aspirin statin therapy, telemetry, check MRI, neuro checks, echocardiogram, carotid Doppler in searching for risk factor modification 2. Type 2 diabetes non-insulin-requiring: Hold oral agents at insulin sliding scale with Accu-Cheks 3. Essential hypertension: Given that onset of symptoms occurred greater than 2 days ago continue her home medications 4. Venous thromboembolism prophylactic: Heparin subcutaneous  All the records are reviewed and case discussed with ED provider. Management plans discussed with the patient, family and they are in agreement.  CODE STATUS: Full  TOTAL TIME TAKING CARE OF THIS PATIENT: 35 minutes.    Denise Bass,  Mardi Mainland.D on 08/22/2015 at 9:13 PM  Between 7am to 6pm - Pager - [phone]  After 6pm: House Pager: [phone]  [facility]  Office  [phone]  CC: Primary care physician; Jerl Mina, MD

## 2015-08-22 NOTE — ED Notes (Signed)
Patient was here in ER 08/19/2015 and diagnosed with CVA, but refused to be admitted for MRI.  Patient presents today with worsening weakness to left side.

## 2015-08-23 ENCOUNTER — Observation Stay: Payer: Medicare Other

## 2015-08-23 ENCOUNTER — Encounter: Payer: Self-pay | Admitting: Radiology

## 2015-08-23 ENCOUNTER — Observation Stay
Admit: 2015-08-23 | Discharge: 2015-08-23 | Disposition: A | Discharge status: Home / Self Care | Payer: Medicare Other | Attending: Internal Medicine | Admitting: Internal Medicine

## 2015-08-23 DIAGNOSIS — I639 Cerebral infarction, unspecified: Secondary | ICD-10-CM | POA: Diagnosis not present

## 2015-08-23 LAB — CBC
HCT: 38.8 % (ref 35.0–47.0)
HEMOGLOBIN: 12.2 g/dL (ref 12.0–16.0)
MCH: 23.4 pg — ABNORMAL LOW (ref 26.0–34.0)
MCHC: 31.4 g/dL — ABNORMAL LOW (ref 32.0–36.0)
MCV: 74.3 fL — ABNORMAL LOW (ref 80.0–100.0)
PLATELETS: 401 10*3/uL (ref 150–440)
RBC: 5.23 MIL/uL — AB (ref 3.80–5.20)
RDW: 17.5 % — ABNORMAL HIGH (ref 11.5–14.5)
WBC: 10.6 10*3/uL (ref 3.6–11.0)

## 2015-08-23 LAB — GLUCOSE, CAPILLARY
GLUCOSE-CAPILLARY: 126 mg/dL — AB (ref 65–99)
GLUCOSE-CAPILLARY: 143 mg/dL — AB (ref 65–99)
Glucose-Capillary: 145 mg/dL — ABNORMAL HIGH (ref 65–99)
Glucose-Capillary: 148 mg/dL — ABNORMAL HIGH (ref 65–99)
Glucose-Capillary: 167 mg/dL — ABNORMAL HIGH (ref 65–99)

## 2015-08-23 LAB — CREATININE, SERUM: CREATININE: 0.79 mg/dL (ref 0.44–1.00)

## 2015-08-23 MED ORDER — NICOTINE 10 MG IN INHA
1.0000 | RESPIRATORY_TRACT | Status: DC | PRN
  Filled 2015-08-23 (×2): qty 36

## 2015-08-23 MED ORDER — CLONAZEPAM 0.5 MG PO TABS
0.5000 mg | ORAL_TABLET | Freq: Two times a day (BID) | ORAL | Status: DC | PRN
  Administered 2015-08-23 (×2): 0.5 mg via ORAL
  Filled 2015-08-23 (×2): qty 1

## 2015-08-23 MED ORDER — ALBUTEROL SULFATE (2.5 MG/3ML) 0.083% IN NEBU: 3.0000 mL | INHALATION_SOLUTION | Freq: Four times a day (QID) | RESPIRATORY_TRACT | Status: DC | PRN

## 2015-08-23 MED ORDER — STROKE: EARLY STAGES OF RECOVERY BOOK
Freq: Once | Status: AC
  Administered 2015-08-23: 01:00:00

## 2015-08-23 MED ORDER — ALPRAZOLAM 0.5 MG PO TABS
0.5000 mg | ORAL_TABLET | Freq: Two times a day (BID) | ORAL | Status: DC
  Administered 2015-08-23 – 2015-08-25 (×5): 0.5 mg via ORAL
  Filled 2015-08-23 (×5): qty 1

## 2015-08-23 MED ORDER — LISINOPRIL 20 MG PO TABS
20.0000 mg | ORAL_TABLET | Freq: Every day | ORAL | Status: DC
  Administered 2015-08-23 – 2015-08-25 (×3): 20 mg via ORAL
  Filled 2015-08-23 (×3): qty 1

## 2015-08-23 MED ORDER — MELATONIN 2.5 MG PO CAPS: 2.5000 mg | ORAL_CAPSULE | Freq: Every day | ORAL | Status: DC

## 2015-08-23 MED ORDER — ENOXAPARIN SODIUM 40 MG/0.4ML ~~LOC~~ SOLN
40.0000 mg | SUBCUTANEOUS | Status: DC
  Administered 2015-08-23 – 2015-08-24 (×2): 40 mg via SUBCUTANEOUS
  Filled 2015-08-23 (×2): qty 0.4

## 2015-08-23 MED ORDER — INSULIN ASPART 100 UNIT/ML ~~LOC~~ SOLN
0.0000 [IU] | Freq: Three times a day (TID) | SUBCUTANEOUS | Status: DC
  Administered 2015-08-23: 2 [IU] via SUBCUTANEOUS
  Administered 2015-08-24: 17:00:00 via SUBCUTANEOUS
  Administered 2015-08-24: 13:00:00 1 [IU] via SUBCUTANEOUS
  Administered 2015-08-24 – 2015-08-25 (×2): 3 [IU] via SUBCUTANEOUS
  Filled 2015-08-23: qty 3
  Filled 2015-08-23: qty 1
  Filled 2015-08-23: qty 3
  Filled 2015-08-23 (×2): qty 2

## 2015-08-23 MED ORDER — NICOTINE POLACRILEX 2 MG MT GUM
2.0000 mg | CHEWING_GUM | OROMUCOSAL | Status: DC | PRN
  Administered 2015-08-23: 2 mg via ORAL
  Filled 2015-08-23 (×2): qty 1

## 2015-08-23 MED ORDER — ASPIRIN EC 81 MG PO TBEC
81.0000 mg | DELAYED_RELEASE_TABLET | Freq: Every day | ORAL | Status: DC
  Administered 2015-08-23 – 2015-08-25 (×3): 81 mg via ORAL
  Filled 2015-08-23 (×3): qty 1

## 2015-08-23 MED ORDER — CYCLOBENZAPRINE HCL 10 MG PO TABS
5.0000 mg | ORAL_TABLET | Freq: Every day | ORAL | Status: DC
  Administered 2015-08-23 – 2015-08-24 (×3): 5 mg via ORAL
  Filled 2015-08-23 (×3): qty 1

## 2015-08-23 MED ORDER — INSULIN ASPART 100 UNIT/ML ~~LOC~~ SOLN: 0.0000 [IU] | Freq: Every day | SUBCUTANEOUS | Status: DC

## 2015-08-23 MED ORDER — IOHEXOL 350 MG/ML SOLN
100.0000 mL | Freq: Once | INTRAVENOUS | Status: AC | PRN
  Administered 2015-08-23: 17:00:00 100 mL via INTRAVENOUS

## 2015-08-23 MED ORDER — METOPROLOL TARTRATE 25 MG PO TABS
25.0000 mg | ORAL_TABLET | Freq: Two times a day (BID) | ORAL | Status: DC
  Administered 2015-08-23 – 2015-08-25 (×5): 25 mg via ORAL
  Filled 2015-08-23 (×5): qty 1

## 2015-08-23 MED ORDER — DULOXETINE HCL 60 MG PO CPEP
60.0000 mg | ORAL_CAPSULE | Freq: Every day | ORAL | Status: DC
  Administered 2015-08-23 – 2015-08-25 (×3): 60 mg via ORAL
  Filled 2015-08-23 (×3): qty 1

## 2015-08-23 MED ORDER — HYDRALAZINE HCL 20 MG/ML IJ SOLN: 10.0000 mg | Freq: Four times a day (QID) | INTRAMUSCULAR | Status: DC | PRN

## 2015-08-23 MED ORDER — DIAZEPAM 5 MG PO TABS
10.0000 mg | ORAL_TABLET | ORAL | Status: AC
  Administered 2015-08-23: 09:00:00 10 mg via ORAL
  Filled 2015-08-23: qty 2

## 2015-08-23 NOTE — Progress Notes (Signed)
OT Cancellation Note  Patient Details Name: Denise Bass MRN: [id number] DOB: 06/20/1971   Cancelled Treatment:    Reason Eval/Treat Not Completed: Other (comment), also order for tomorrow.  Ocie CornfieldHuff, Kylinn Shropshire M 08/23/2015, 11:26 AM
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
## 2015-08-23 NOTE — Progress Notes (Addendum)
Patient refuses bed alarm. 

## 2015-08-23 NOTE — Progress Notes (Signed)
OT Cancellation Note  Patient Details Name: Denise Bass MRN: [id number] DOB: 03/18/1971   Cancelled Treatment:    Reason Eval/Treat Not Completed: Patient at procedure or test/ unavailable. Patient out of room for MRI and ultra sound.  Ocie CornfieldHuff, Deshante Cassell M 08/23/2015, 9:38 AM

## 2015-08-23 NOTE — Progress Notes (Signed)
*  PRELIMINARY RESULTS* Echocardiogram 2D Echocardiogram has been performed.  Denise Bass 08/23/2015, 11:28 AM

## 2015-08-23 NOTE — Progress Notes (Signed)
Completed Health Directive Education.  Patient will have Chaplain paged when she is ready to complete it.  Estimates today around 2:00pm Chaplain Ronney LionDonna S Craig Ext [phone]

## 2015-08-23 NOTE — Progress Notes (Signed)
[facility] - [place] at [facility]   PATIENT NAME: Denise LennoxGinger Parilla    MR#:  [id number]  DATE OF BIRTH:  03/26/1971  SUBJECTIVE:  CHIEF COMPLAINT:   Chief Complaint  Patient presents with  . Weakness     Left side weak.  REVIEW OF SYSTEMS:  CONSTITUTIONAL: No fever, fatigue or weakness.  EYES: No blurred or double vision.  EARS, NOSE, AND THROAT: No tinnitus or ear pain.  RESPIRATORY: No cough, shortness of breath, wheezing or hemoptysis.  CARDIOVASCULAR: No chest pain, orthopnea, edema.  GASTROINTESTINAL: No nausea, vomiting, diarrhea or abdominal pain.  GENITOURINARY: No dysuria, hematuria.  ENDOCRINE: No polyuria, nocturia,  HEMATOLOGY: No anemia, easy bruising or bleeding SKIN: No rash or lesion. MUSCULOSKELETAL: No joint pain or arthritis.   NEUROLOGIC: No tingling, numbness, left side  weakness.  PSYCHIATRY: No anxiety or depression.   ROS  DRUG ALLERGIES:   Allergies  Allergen Reactions  . Chantix [Varenicline] Hives  . Latex Rash and Other (See Comments)    Reaction:  Burning   . Penicillins Hives and Other (See Comments)    Has patient had a PCN reaction causing immediate rash, facial/tongue/throat swelling, SOB or lightheadedness with hypotension: No Has patient had a PCN reaction causing severe rash involving mucus membranes or skin necrosis: No Has patient had a PCN reaction that required hospitalization No Has patient had a PCN reaction occurring within the last 10 years: No If all of the above answers are "NO", then may proceed with Cephalosporin use.    VITALS:  Blood pressure 165/99, pulse 83, temperature 98.7 F (37.1 C), temperature source Oral, resp. rate 18, height 5\' 5"  (1.651 m), weight 99.791 kg (220 lb), last menstrual period 11/29/2014, SpO2 97 %.  PHYSICAL EXAMINATION:  GENERAL:  44 y.o.-year-old obase patient lying in the bed with no acute distress. Sleepy. EYES: Pupils equal, round, reactive to light. Have cloudy  cornea. No scleral icterus. Extraocular muscles intact.  HEENT: Head atraumatic, normocephalic. Oropharynx and nasopharynx clear. Have hearing deficit. NECK:  Supple, no jugular venous distention. No thyroid enlargement, no tenderness.  LUNGS: Normal breath sounds bilaterally, no wheezing, rales,rhonchi or crepitation. No use of accessory muscles of respiration.  CARDIOVASCULAR: S1, S2 normal. No murmurs, rubs, or gallops.  ABDOMEN: Soft, nontender, nondistended. Bowel sounds present. No organomegaly or mass.  EXTREMITIES: No pedal edema, cyanosis, or clubbing.  NEUROLOGIC: Cranial nerves II through XII are intact. Muscle strength 5/5 in right side extremities and 4/5 in left side. Sensation intact. Gait not checked.  PSYCHIATRIC: The patient is alert and oriented x 3. But not able to understand the details or seriousness of the disease. SKIN: No obvious rash, lesion, or ulcer.   Physical Exam LABORATORY PANEL:   CBC  Recent Labs Lab 08/23/15 0647  WBC 10.6  HGB 12.2  HCT 38.8  PLT 401   ------------------------------------------------------------------------------------------------------------------  Chemistries   Recent Labs Lab 08/19/15 2125 08/22/15 1610 08/23/15 0647  NA 133* 137  --   K 3.7 3.9  --   CL 105 103  --   CO2 23 27  --   GLUCOSE 157* 142*  --   BUN 15 8  --   CREATININE 1.00 0.71 0.79  CALCIUM 9.1 9.2  --   AST 15  --   --   ALT 16  --   --   ALKPHOS 111  --   --   BILITOT 0.5  --   --    ------------------------------------------------------------------------------------------------------------------  Cardiac Enzymes  Recent Labs Lab 08/19/15 2125  TROPONINI <0.03   ------------------------------------------------------------------------------------------------------------------  RADIOLOGY:  US Carotid Bilateral  08/23/2015  CLINICAL DATA:  CVA, hypertension, syncope, diabetes and tobacco use. EXAM: BILATERAL CAROTID DUPLEX ULTRASOUND  TECHNIQUE: Wallace Cullens scale imaging, color Doppler and duplex ultrasound were performed of bilateral carotid and vertebral arteries in the neck. COMPARISON:  None. FINDINGS: Criteria: Quantification of carotid stenosis is based on velocity parameters that correlate the residual internal carotid diameter with NASCET-based stenosis levels, using the diameter of the distal internal carotid lumen as the denominator for stenosis measurement. The following velocity measurements were obtained: RIGHT ICA:  448/145 cm/sec CCA:  80/27 cm/sec SYSTOLIC ICA/CCA RATIO:  5.6 DIASTOLIC ICA/CCA RATIO:  5.4 ECA:  306 cm/sec LEFT ICA:  181/56 cm/sec CCA:  129/35 cm/sec SYSTOLIC ICA/CCA RATIO:  1.4 DIASTOLIC ICA/CCA RATIO:  1.6 ECA:  184 cm/sec RIGHT CAROTID ARTERY: There is moderate partially calcified plaque in the carotid bulb and internal carotid artery. Turbulent flow and elevated velocities in the proximal right ICA correspond to an estimated greater than 70% stenosis. RIGHT VERTEBRAL ARTERY: Antegrade flow with normal waveform and velocity. LEFT CAROTID ARTERY: A moderate amount of partially calcified plaque is present at the level of the carotid bulb and proximal internal carotid artery. Based on velocities, estimated left ICA stenosis is 50- 69%. LEFT VERTEBRAL ARTERY: Antegrade flow with normal waveform and velocity. Incidental note of a predominantly solid and partially cystic nodule in the left lobe of the thyroid gland measuring roughly 3.3 x 2.2 x 2.6 cm. IMPRESSION: 1. 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 2. Evidence of estimated 50- 69% proximal left ICA stenosis. 3. Incidental 3 cm left thyroid nodule. Dedicated thyroid ultrasound is recommended for further evaluation. Electronically Signed   By: Irish Lack M.D.   On: 08/23/2015 11:21    ASSESSMENT AND PLAN:   Active Problems:   CVA (cerebral infarction)   44 year old Caucasian female  history of type 2 diabetes non-insulin-requiring complicated by neuropathy who is presenting with left-sided weakness.  1. CVA, unspecified: Aspirin statin therapy, telemetry,    Could not do MRI as she told " she needs sedation for that" and out hospital is not equiped for that.    Advised to get as out pt.    Echo and carotid doppler reviewed- Doppler have > 70% blockage in right ICA.    Called vascular consult.    As pt does not seem to be understanding importance of all these- I called and spoke to her sister on phone and explained her.    Get PT- may need rehab. 2. Type 2 diabetes non-insulin-requiring: Hold oral agents at insulin sliding scale with Accu-Cheks 3. Essential hypertension: Given that onset of symptoms occurred greater than 2 days ago continue her home medications 4. Venous thromboembolism prophylactic: Heparin subcutaneous 5. Smoking   Councelled strongly against it, spoke to her sister also. Total time for this is 4 min.  All the records are reviewed and case discussed with Care Management/Social Workerr. Management plans discussed with the patient, family and they are in agreement.  CODE STATUS: full TOTAL TIME TAKING CARE OF THIS PATIENT: 35 minutes.     POSSIBLE D/C IN 1-2 DAYS, DEPENDING ON CLINICAL CONDITION.   Altamese Dilling M.D on 08/23/2015   Between 7am to 6pm - Pager - [phone]  After 6pm go to [email or web address] - password [facility]  Office  [phone]  CC: Primary care physician; Jerl Mina, MD  Note: This dictation was prepared with Dragon dictation along with smaller phrase technology. Any transcriptional errors that result from this process are unintentional.
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
## 2015-08-23 NOTE — Plan of Care (Signed)
Problem: Discharge/Transitional Outcomes Goal: Other Discharge Outcomes/Goals Outcome: Progressing Plan of care progress to goal:  Patient complaining of generalized pain and pain in bilateral legs - PRN pain medication given; relief needed. Anti-anxiety medications given. BP remains elevated. Telemetry monitoring continues. Vascular consult placed due to >70% blockage in right ICA. CT neck in progress. Family at bedside.

## 2015-08-23 NOTE — Plan of Care (Signed)
Problem: Discharge/Transitional Outcomes Goal: Other Discharge Outcomes/Goals Outcome: Progressing Plan of Care Progress to Goal:   Pt is resting comfortably, pt report pain once during shift. BP is elevated. No other signs of distress noted. Will continue to monitor.

## 2015-08-23 NOTE — Progress Notes (Signed)
PHARMACIST - PHYSICIAN ORDER COMMUNICATION  CONCERNING: P&T Medication Policy on Herbal Medications  DESCRIPTION:  This patient's order for:  Melatonin 2.5 mg capsule  has been noted.  This product(s) is classified as an "herbal" or natural product. Due to a lack of definitive safety studies or FDA approval, nonstandard manufacturing practices, plus the potential risk of unknown drug-drug interactions while on inpatient medications, the Pharmacy and Therapeutics Committee does not permit the use of "herbal" or natural products of this type within [facility].   ACTION TAKEN: The pharmacy department is unable to verify this order at this time and your patient has been informed of this safety policy. Please reevaluate patient's clinical condition at discharge and address if the herbal or natural product(s) should be resumed at that time.

## 2015-08-23 NOTE — Progress Notes (Signed)
   08/23/15 1900  Clinical Encounter Type  Visited With Patient and family together  Visit Type Initial  Referral From Nurse  Consult/Referral To Chaplain  Spiritual Encounters  Spiritual Needs Literature  Advance Directives (For Healthcare)  Does patient have an advance directive? No  Would patient like information on creating an advanced directive? Yes - Transport plannerducational materials given  Provided ProofreaderAdvance Directive education.  No notary available at the moment.  Unit chaplain will follow up in the morning when more witness and notary are available.  [employer]bury Automotive GroupChaplain Jalesha Plotz-pager [phone]

## 2015-08-24 DIAGNOSIS — I639 Cerebral infarction, unspecified: Secondary | ICD-10-CM | POA: Diagnosis not present

## 2015-08-24 LAB — GLUCOSE, CAPILLARY
GLUCOSE-CAPILLARY: 234 mg/dL — AB (ref 65–99)
GLUCOSE-CAPILLARY: 165 mg/dL — AB (ref 65–99)
GLUCOSE-CAPILLARY: 179 mg/dL — AB (ref 65–99)
Glucose-Capillary: 130 mg/dL — ABNORMAL HIGH (ref 65–99)

## 2015-08-24 MED ORDER — ISOSORBIDE MONONITRATE ER 30 MG PO TB24
30.0000 mg | ORAL_TABLET | Freq: Every day | ORAL | Status: DC
  Administered 2015-08-24 – 2015-08-25 (×2): 30 mg via ORAL
  Filled 2015-08-24 (×2): qty 1

## 2015-08-24 MED ORDER — HYDRALAZINE HCL 10 MG PO TABS
10.0000 mg | ORAL_TABLET | Freq: Three times a day (TID) | ORAL | Status: DC
  Administered 2015-08-24 – 2015-08-25 (×4): 10 mg via ORAL
  Filled 2015-08-24 (×5): qty 1

## 2015-08-24 NOTE — Progress Notes (Signed)
[facility] [place] [facility]   PATIENT NAME: Denise LennoxGinger Legaspi    MR#:  [id number]  DATE OF BIRTH:  12/16/1970  SUBJECTIVE:  CHIEF COMPLAINT:   Chief Complaint  Patient presents with  . Weakness     Left side weak. C/o on- off chest pain. Asking me - when can she go home.  REVIEW OF SYSTEMS:  CONSTITUTIONAL: No fever, fatigue or weakness.  EYES: No blurred or double vision.  EARS, NOSE, AND THROAT: No tinnitus or ear pain.  RESPIRATORY: No cough, shortness of breath, wheezing or hemoptysis.  CARDIOVASCULAR: No chest pain, orthopnea, edema.  GASTROINTESTINAL: No nausea, vomiting, diarrhea or abdominal pain.  GENITOURINARY: No dysuria, hematuria.  ENDOCRINE: No polyuria, nocturia,  HEMATOLOGY: No anemia, easy bruising or bleeding SKIN: No rash or lesion. MUSCULOSKELETAL: No joint pain or arthritis.   NEUROLOGIC: No tingling, numbness, left side  weakness.  PSYCHIATRY: No anxiety or depression.   ROS  DRUG ALLERGIES:   Allergies  Allergen Reactions  . Chantix [Varenicline] Hives  . Latex Rash and Other (See Comments)    Reaction:  Burning   . Penicillins Hives and Other (See Comments)    Has patient had a PCN reaction causing immediate rash, facial/tongue/throat swelling, SOB or lightheadedness with hypotension: No Has patient had a PCN reaction causing severe rash involving mucus membranes or skin necrosis: No Has patient had a PCN reaction that required hospitalization No Has patient had a PCN reaction occurring within the last 10 years: No If all of the above answers are "NO", then may proceed with Cephalosporin use.    VITALS:  Blood pressure 153/94, pulse 90, temperature 98.1 F (36.7 C), temperature source Oral, resp. rate 20, height 5\' 5"  (1.651 m), weight 99.791 kg (220 lb), last menstrual period 11/29/2014, SpO2 95 %.  PHYSICAL EXAMINATION:  GENERAL:  44 y.o.-year-old obase patient lying in the bed with no acute distress.  Sleepy. EYES: Pupils equal, round, reactive to light. Have cloudy cornea. No scleral icterus. Extraocular muscles intact.  HEENT: Head atraumatic, normocephalic. Oropharynx and nasopharynx clear. Have hearing deficit. NECK:  Supple, no jugular venous distention. No thyroid enlargement, no tenderness.  LUNGS: Normal breath sounds bilaterally, no wheezing, rales,rhonchi or crepitation. No use of accessory muscles of respiration.  CARDIOVASCULAR: S1, S2 normal. No murmurs, rubs, or gallops.  ABDOMEN: Soft, nontender, nondistended. Bowel sounds present. No organomegaly or mass.  EXTREMITIES: No pedal edema, cyanosis, or clubbing.  NEUROLOGIC: Cranial nerves II through XII are intact. Muscle strength 5/5 in right side extremities and 4/5 in left side. Sensation intact. Gait not checked.  PSYCHIATRIC: The patient is alert and oriented x 3. But not able to understand the details or seriousness of the disease. SKIN: No obvious rash, lesion, or ulcer.   Physical Exam LABORATORY PANEL:   CBC  Recent Labs Lab 08/23/15 0647  WBC 10.6  HGB 12.2  HCT 38.8  PLT 401   ------------------------------------------------------------------------------------------------------------------  Chemistries   Recent Labs Lab 08/19/15 2125 08/22/15 1610 08/23/15 0647  NA 133* 137  --   K 3.7 3.9  --   CL 105 103  --   CO2 23 27  --   GLUCOSE 157* 142*  --   BUN 15 8  --   CREATININE 1.00 0.71 0.79  CALCIUM 9.1 9.2  --   AST 15  --   --   ALT 16  --   --   ALKPHOS 111  --   --  BILITOT 0.5  --   --    ------------------------------------------------------------------------------------------------------------------  Cardiac Enzymes  Recent Labs Lab 08/19/15 2125  TROPONINI <0.03   ------------------------------------------------------------------------------------------------------------------  RADIOLOGY:  Ct Angio Neck W/cm &/or Wo/cm  08/23/2015  CLINICAL DATA:  Carotid artery  stenosis. Left-sided weakness. History of diabetes, hypertension, and hyperlipidemia. EXAM: CT ANGIOGRAPHY NECK TECHNIQUE: Multidetector CT imaging of the neck was performed using the standard protocol during bolus administration of intravenous contrast. Multiplanar CT image reconstructions and MIPs were obtained to evaluate the vascular anatomy. Carotid stenosis measurements (when applicable) are obtained utilizing NASCET criteria, using the distal internal carotid diameter as the denominator. CONTRAST:  OMNIPAQUE IOHEXOL 350 MG/ML SOLN COMPARISON:  Carotid ultrasound 08/23/2015 FINDINGS: Examination is mildly limited by patient body habitus. Aortic arch: Normal variant aortic arch branching pattern with common origin of the brachiocephalic and left common carotid arteries noted. There is mild calcified plaque in the distal aortic arch. There is also mild to moderate circumferential soft tissue partially visualized about the distal arch and proximal descending thoracic aorta as well as involving the proximal arch vessels. There is approximately 70% stenosis of the proximal right subclavian artery near its origin, and there is also approximately 70% stenosis of the left subclavian artery approximately 1.5 cm beyond its origin. Right carotid system: Common carotid artery is patent with mild wall thickening. There is high-grade stenosis of the internal carotid artery at its origin of greater than 80%. The cervical ICA is diffusely small in caliber overall and slightly smaller in caliber compared to the contralateral side. Left carotid system: Common carotid artery is patent with mild diffuse wall thickening. There is 40% stenosis of the proximal ICA. Mild diffuse soft tissue thickening is present about the ICA as well. Vertebral arteries: Vertebral arteries are patent and equal in size without stenosis. Skeleton: No acute osseous abnormality. Other neck: Evidence of chronic right maxillary sinusitis with mild  mucosal thickening currently. Small left mastoid effusion. Heterogeneous thyroid diffusely with asymmetric enlargement of the left lobe corresponding to the 3 cm nodule described on ultrasound earlier today. IMPRESSION: 1. 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 2. 40% proximal left ICA stenosis. 3. 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 4. While these stenoses may reflect atherosclerotic disease given patient's medical history, there is evidence of diffuse wall thickening involving a portion of the aorta as well as proximal arch vessels and carotid arteries and a large vessel vasculitis is a consideration. Electronically Signed   By: Sebastian Ache M.D.   On: 08/23/2015 17:35   US Carotid Bilateral  08/23/2015  CLINICAL DATA:  CVA, hypertension, syncope, diabetes and tobacco use. EXAM: BILATERAL CAROTID DUPLEX ULTRASOUND TECHNIQUE: Wallace Cullens scale imaging, color Doppler and duplex ultrasound were performed of bilateral carotid and vertebral arteries in the neck. COMPARISON:  None. FINDINGS: Criteria: Quantification of carotid stenosis is based on velocity parameters that correlate the residual internal carotid diameter with NASCET-based stenosis levels, using the diameter of the distal internal carotid lumen as the denominator for stenosis measurement. The following velocity measurements were obtained: RIGHT ICA:  448/145 cm/sec CCA:  80/27 cm/sec SYSTOLIC ICA/CCA RATIO:  5.6 DIASTOLIC ICA/CCA RATIO:  5.4 ECA:  306 cm/sec LEFT ICA:  181/56 cm/sec CCA:  129/35 cm/sec SYSTOLIC ICA/CCA RATIO:  1.4 DIASTOLIC ICA/CCA RATIO:  1.6 ECA:  184 cm/sec RIGHT CAROTID ARTERY: There is moderate partially calcified plaque in the carotid bulb and internal carotid artery. Turbulent flow and elevated velocities in the proximal  right ICA correspond to an estimated greater than 70% stenosis. RIGHT VERTEBRAL ARTERY: Antegrade flow with normal waveform and velocity. LEFT CAROTID  ARTERY: A moderate amount of partially calcified plaque is present at the level of the carotid bulb and proximal internal carotid artery. Based on velocities, estimated left ICA stenosis is 50- 69%. LEFT VERTEBRAL ARTERY: Antegrade flow with normal waveform and velocity. Incidental note of a predominantly solid and partially cystic nodule in the left lobe of the thyroid gland measuring roughly 3.3 x 2.2 x 2.6 cm. IMPRESSION: 1. 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 2. Evidence of estimated 50- 69% proximal left ICA stenosis. 3. Incidental 3 cm left thyroid nodule. Dedicated thyroid ultrasound is recommended for further evaluation. Electronically Signed   By: Irish Lack M.D.   On: 08/23/2015 11:21    ASSESSMENT AND PLAN:   Active Problems:   CVA (cerebral infarction)   44 year old Caucasian female history of type 2 diabetes non-insulin-requiring complicated by neuropathy who is presenting with left-sided weakness.  1. CVA, unspecified: Aspirin statin therapy, telemetry,    Could not do MRI as she told " she needs sedation for that" and out hospital is not equiped for that.    Advised to get as out pt.    Echo and carotid doppler reviewed- Doppler have > 70% blockage in right ICA.    Called vascular consult. CT angio of neck is done > 80% blockage on Right ICA, with some more blockages in subclavian arteries.    As pt does not seem to be understanding importance of all these- I called and spoke to her sister on phone and explained her.    PT suggested, no services at home on d/c. 2. Type 2 diabetes non-insulin-requiring: Hold oral agents at insulin sliding scale with Accu-Cheks 3. Essential hypertension: Given that onset of symptoms occurred greater than 2 days ago continue her home medications 4. Venous thromboembolism prophylactic: Heparin subcutaneous 5. Smoking   Councelled strongly against it, spoke to her  sister also. Total time for this is 4 min. 6. Angina     Had also on-off complain of chest pain, as probably she may need vascular intervention and have extensive vascular atherosclerosis on CT angio- so called cardio consult.  All the records are reviewed and case discussed with Care Management/Social Workerr. Management plans discussed with the patient, family and they are in agreement.  CODE STATUS: full TOTAL TIME TAKING CARE OF THIS PATIENT: 35 minutes.   POSSIBLE D/C IN 1-2 DAYS, DEPENDING ON CLINICAL CONDITION.   Altamese Dilling M.D on 08/24/2015   Between 7am to 6pm - Pager - [phone]  After 6pm go to [email or web address] - password [email or web address]  [facility]  Office  [phone]  CC: Primary care physician; Jerl Mina, MD  Note: This dictation was prepared with Dragon dictation along with smaller phrase technology. Any transcriptional errors that result from this process are unintentional.

## 2015-08-24 NOTE — Consult Note (Signed)
[facility] Vascular Consult Note  MRN : [id number]  Denise Bass is a 44 y.o. (03-05-1971) female who presents with chief complaint of  Chief Complaint  Patient presents with  . Weakness  .  History of Present Illness: Patient is a 44 yo WF with extensive PMH and physiologically much older than her stated age.  She is admitted with left arm weakness and numbness.  She has pretty severe neuropathy, and initially was unclear if this was new or old symptoms.  This persisted and got worse and she was admitted for likely stroke.  She had a stroke about 4 or 5 years ago as well.  She has a heavy history of tobacco use.  Has poorly controlled DM.  There is a question of glycogen storage disease.  Had syncope and dizziness as well over past few weeks.  No speech or swallowing issues.  Has limited visual acuity but no new symptoms.   Had a CT scan done which I have independently reviewed.  Has 80-90% right ICA stenosis at the bifurcation.  Left carotid stenosis <50%.  Also has moderate bilateral subclavian artery lesions.   We are consulted for further evaluation and treatment.  Current Facility-Administered Medications  Medication Dose Route Frequency Provider Last Rate Last Dose  . acetaminophen (TYLENOL) tablet 650 mg  650 mg Oral Q6H PRN Wyatt Haste, MD   650 mg at 08/23/15 6440   Or  . acetaminophen (TYLENOL) suppository 650 mg  650 mg Rectal Q6H PRN Wyatt Haste, MD      . albuterol (PROVENTIL) (2.5 MG/3ML) 0.083% nebulizer solution 3 mL  3 mL Inhalation Q6H PRN Wyatt Haste, MD      . ALPRAZolam Prudy Feeler) tablet 0.5 mg  0.5 mg Oral BID Altamese Dilling, MD   0.5 mg at 08/24/15 0926  . aspirin EC tablet 81 mg  81 mg Oral Daily Wyatt Haste, MD   81 mg at 08/24/15 3474  . atorvastatin (LIPITOR) tablet 40 mg  40 mg Oral q1800 Wyatt Haste, MD   40 mg at 08/23/15 1745  . clonazePAM (KLONOPIN) tablet 0.5 mg  0.5 mg Oral BID PRN Wyatt Haste, MD   0.5 mg at  08/23/15 0856  . cyclobenzaprine (FLEXERIL) tablet 5 mg  5 mg Oral QHS Wyatt Haste, MD   5 mg at 08/23/15 2116  . DULoxetine (CYMBALTA) DR capsule 60 mg  60 mg Oral Daily Wyatt Haste, MD   60 mg at 08/24/15 0926  . enoxaparin (LOVENOX) injection 40 mg  40 mg Subcutaneous Q24H Altamese Dilling, MD   40 mg at 08/24/15 1322  . hydrALAZINE (APRESOLINE) injection 10 mg  10 mg Intravenous Q6H PRN Enid Baas, MD      . hydrALAZINE (APRESOLINE) tablet 10 mg  10 mg Oral 3 times per day Altamese Dilling, MD   10 mg at 08/24/15 1322  . insulin aspart (novoLOG) injection 0-5 Units  0-5 Units Subcutaneous QHS Wyatt Haste, MD   0 Units at 08/23/15 0033  . insulin aspart (novoLOG) injection 0-9 Units  0-9 Units Subcutaneous TID WC Wyatt Haste, MD   1 Units at 08/24/15 1323  . isosorbide mononitrate (IMDUR) 24 hr tablet 30 mg  30 mg Oral Daily Marcina Millard, MD   30 mg at 08/24/15 1418  . lisinopril (PRINIVIL,ZESTRIL) tablet 20 mg  20 mg Oral Daily Wyatt Haste, MD   20 mg at 08/24/15 0926  .  metoprolol tartrate (LOPRESSOR) tablet 25 mg  25 mg Oral BID Wyatt Haste, MD   25 mg at 08/24/15 0926  . morphine 2 MG/ML injection 2 mg  2 mg Intravenous Q4H PRN Wyatt Haste, MD   2 mg at 08/23/15 1601  . nicotine polacrilex (NICORETTE) gum 2 mg  2 mg Oral PRN Altamese Dilling, MD   2 mg at 08/23/15 1746  . ondansetron (ZOFRAN) tablet 4 mg  4 mg Oral Q6H PRN Wyatt Haste, MD       Or  . ondansetron [facility]) injection 4 mg  4 mg Intravenous Q6H PRN Wyatt Haste, MD      . oxyCODONE (Oxy IR/ROXICODONE) immediate release tablet 5 mg  5 mg Oral Q4H PRN Wyatt Haste, MD   5 mg at 08/24/15 1112  . sodium chloride 0.9 % injection 3 mL  3 mL Intravenous Q12H Wyatt Haste, MD   3 mL at 08/24/15 0932    Past Medical History  Diagnosis Date  . Diabetes mellitus without complication (HCC)   . Anxiety   . Arthritis   . COPD (chronic obstructive pulmonary disease) (HCC)   . Emphysema of  lung (HCC)   . GERD (gastroesophageal reflux disease)   . Hypertension   . 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 Neuromuscular disorder (HCC)   . Osteoarthritis of both knees   . AC (acromioclavicular) joint bone spurs     knees  . Stroke (HCC)   . Substance abuse   . Genetic blood cell disorder     Rare/unknown/still researching at [place]/  . Polyclonal gammopathy   . Elevated sed rate   . Leukocytosis   . Idiopathic neuropathy   . HLD (hyperlipidemia)   . Folliculitis   . Lumbago   . Sensory urge incontinence     Past Surgical History  Procedure Laterality Date  . Cholecystectomy    . Dilation and curettage of uterus    . Tonsilectomy, adenoidectomy, bilateral myringotomy and tubes    . Corneal transplant      Social History Social History  Substance Use Topics  . Smoking status: Current Every Day Smoker -- 2.00 packs/day for 29 years    Types: Cigarettes  . Smokeless tobacco: Never Used     Comment: usine nicotine patches  . Alcohol Use: No    Family History Family History  Problem Relation Age of Onset  . Stroke Mother   . COPD Mother   . Emphysema Mother   . Asthma Mother   . Liver disease Father   . Stroke Father     cerebral hemrhage  . Tuberculosis Father   . Mental illness Father   . Mental illness Brother   . Heart disease Father     Allergies  Allergen Reactions  . Chantix [Varenicline] Hives  . Latex Rash and Other (See Comments)    Reaction:  Burning   . Penicillins Hives and Other (See Comments)    Has patient had a PCN reaction causing immediate rash, facial/tongue/throat swelling, SOB or lightheadedness with hypotension: No Has patient had a PCN reaction causing severe rash involving mucus membranes or skin necrosis: No Has patient had a PCN reaction that required hospitalization No Has patient had a PCN reaction occurring within the last 10 years: No If all of the above answers are "NO", then may  proceed with Cephalosporin use.     REVIEW  OF SYSTEMS (Negative unless checked)  Constitutional: [] Weight loss  [] Fever  [] Chills Cardiac: [] Chest pain   [x] Chest pressure   [] Palpitations   [] Shortness of breath when laying flat   [] Shortness of breath at rest   [x] Shortness of breath with exertion. Vascular:  [] Pain in legs with walking   [] Pain in legs at rest   [] Pain in legs when laying flat   [] Claudication   [] Pain in feet when walking  [] Pain in feet at rest  [] Pain in feet when laying flat   [] History of DVT   [] Phlebitis   [x] Swelling in legs   [] Varicose veins   [] Non-healing ulcers Pulmonary:   [] Uses home oxygen   [] Productive cough   [] Hemoptysis   [] Wheeze  [x] COPD   [] Asthma Neurologic:  [] Dizziness  [x] Blackouts   [] Seizures   [x] History of stroke   [] History of TIA  [] Aphasia   [] Temporary blindness   [] Dysphagia   [x] Weakness or numbness in arms   [x] Weakness or numbness in legs Musculoskeletal:  [] Arthritis   [] Joint swelling   [] Joint pain   [] Low back pain Hematologic:  [] Easy bruising  [] Easy bleeding   [] Hypercoagulable state   [] Anemic  [] Hepatitis Gastrointestinal:  [] Blood in stool   [] Vomiting blood  [] Gastroesophageal reflux/heartburn   [] Difficulty swallowing. Genitourinary:  [x] Chronic kidney disease   [] Difficult urination  [] Frequent urination  [] Burning with urination   [] Blood in urine Skin:  [] Rashes   [] Ulcers   [] Wounds Psychological:  [] History of anxiety   []  History of major depression.  Physical Examination  Filed Vitals:   08/24/15 0122 08/24/15 0520 08/24/15 0850 08/24/15 1418  BP: 152/72 193/109 153/94 155/88  Pulse: 77 87 90 77  Temp: 97.6 F (36.4 C) 98.2 F (36.8 C) 98.1 F (36.7 C) 97.9 F (36.6 C)  TempSrc: Oral Oral Oral Oral  Resp:  18 20 18   Height:      Weight:      SpO2: 98% 94% 95% 96%   Body mass index is 36.61 kg/(m^2). Gen:  WD/WN, NAD. Appears much older than stated age. Head: [place]/AT, No temporalis  wasting. Ear/Nose/Throat: Hearing diminished, nares w/o erythema or drainage, oropharynx w/o Erythema/Exudate Eyes: PERRLA, EOMI. Visual acuity poor. Eyes with a white haze apparent Neck: Supple, no nuchal rigidity.  Right carotid bruit Pulmonary:  Good air movement, equal, clear to auscultation bilaterally.  Cardiac: RRR, normal S1, S2, no Murmurs, rubs or gallops. Vascular: right carotid bruit Vessel Right Left  Radial Palpable Palpable                                   Gastrointestinal: soft, non-tender/non-distended. No guarding/reflex. No masses, surgical incisions, or scars. Musculoskeletal: LUE with some weakness.  Extremities without ischemic changes.  No deformity or atrophy. Mild LE edema Neurologic: CN 2-12 intact. Decreased sensation BLE.  Symmetrical.  Speech is fluent. Motor exam as listed above. Psychiatric: Judgment intact, Mood & affect appropriate for pt's clinical situation. Dermatologic: No rashes or ulcers noted.  No cellulitis or open wounds. Lymph : No Cervical, Axillary, or Inguinal lymphadenopathy.      CBC Lab Results  Component Value Date   WBC 10.6 08/23/2015   HGB 12.2 08/23/2015   HCT 38.8 08/23/2015   MCV 74.3* 08/23/2015   PLT 401 08/23/2015    BMET    Component Value Date/Time   NA 137 08/22/2015 1610   NA 137 08/31/2014 0415  K 3.9 08/22/2015 1610   K 3.8 08/31/2014 0415   CL 103 08/22/2015 1610   CL 104 08/31/2014 0415   CO2 27 08/22/2015 1610   CO2 29 08/31/2014 0415   GLUCOSE 142* 08/22/2015 1610   GLUCOSE 149* 08/31/2014 0415   BUN 8 08/22/2015 1610   BUN 11 08/31/2014 0415   CREATININE 0.79 08/23/2015 0647   CREATININE 1.33* 08/31/2014 0415   CALCIUM 9.2 08/22/2015 1610   CALCIUM 8.5 08/31/2014 0415   GFRNONAA >60 08/23/2015 0647   GFRNONAA 40* 08/30/2014 0412   GFRNONAA >60 01/27/2014 1613   GFRAA >60 08/23/2015 0647   GFRAA 49* 08/30/2014 0412   GFRAA >60 01/27/2014 1613   Estimated Creatinine Clearance: 107.2  mL/min (by C-G formula based on Cr of 0.79).  COAG Lab Results  Component Value Date   INR 1.06 08/19/2015    Radiology Dg Chest 2 View  08/19/2015  CLINICAL DATA:  Chest pain. EXAM: CHEST  2 VIEW COMPARISON:  July 26, 2014. FINDINGS: The heart size and mediastinal contours are within normal limits. Both lungs are clear. No pneumothorax or pleural effusion is noted. The visualized skeletal structures are unremarkable. IMPRESSION: No active cardiopulmonary disease. Electronically Signed   By: Lupita Raider, M.D.   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 COMPARISON:  07/26/2014 FINDINGS: Normal ventricular morphology. No midline shift or mass effect. Normal appearance of brain parenchyma. No intracranial hemorrhage, mass lesion, or evidence acute infarction. No extra-axial fluid collections. Mucosal thickening RIGHT maxillary sinus. No acute osseous findings. IMPRESSION: No acute intracranial abnormalities. Electronically Signed   By: Ulyses Southward M.D.   On: 08/19/2015 21:56   Ct Angio Neck W/cm &/or Wo/cm  08/23/2015  CLINICAL DATA:  Carotid artery stenosis. Left-sided weakness. History of diabetes, hypertension, and hyperlipidemia. EXAM: CT ANGIOGRAPHY NECK TECHNIQUE: Multidetector CT imaging of the neck was performed using the standard protocol during bolus administration of intravenous contrast. Multiplanar CT image reconstructions and MIPs were obtained to evaluate the vascular anatomy. Carotid stenosis measurements (when applicable) are obtained utilizing NASCET criteria, using the distal internal carotid diameter as the denominator. CONTRAST:  OMNIPAQUE  IOHEXOL 350 MG/ML SOLN COMPARISON:  Carotid ultrasound 08/23/2015 FINDINGS: Examination is mildly limited by patient body habitus. Aortic arch: Normal variant aortic arch branching pattern with common origin of the brachiocephalic and left common carotid arteries noted. There is mild calcified plaque in the distal aortic arch. There is also mild to moderate circumferential soft tissue partially visualized about the distal arch and proximal descending thoracic aorta as well as involving the proximal arch vessels. There is approximately 70% stenosis of the proximal right subclavian artery near its origin, and there is also approximately 70% stenosis of the left subclavian artery approximately 1.5 cm beyond its origin. Right carotid system: Common carotid artery is patent with mild wall thickening. There is high-grade stenosis of the internal carotid artery at its origin of greater than 80%. The cervical ICA is diffusely small in caliber overall and slightly smaller in caliber compared to the contralateral side. Left carotid system: Common carotid artery is patent with mild diffuse wall thickening. There is 40% stenosis of the proximal ICA. Mild diffuse soft tissue thickening  is present about the ICA as well. Vertebral arteries: Vertebral arteries are patent and equal in size without stenosis. Skeleton: No acute osseous abnormality. Other neck: Evidence of chronic right maxillary sinusitis with mild mucosal thickening currently. Small left mastoid effusion. Heterogeneous thyroid diffusely with asymmetric enlargement of the left lobe corresponding to the 3 cm nodule described on ultrasound earlier today. IMPRESSION: 1. 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 2. 40% proximal left ICA stenosis. 3. 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 4. While these stenoses may reflect atherosclerotic disease given patient's medical history, there is evidence of diffuse wall  thickening involving a portion of the aorta as well as proximal arch vessels and carotid arteries and a large vessel vasculitis is a consideration. Electronically Signed   By: Sebastian Ache M.D.   On: 08/23/2015 17:35   US Carotid Bilateral  08/23/2015  CLINICAL DATA:  CVA, hypertension, syncope, diabetes and tobacco use. EXAM: BILATERAL CAROTID DUPLEX ULTRASOUND TECHNIQUE: Wallace Cullens scale imaging, color Doppler and duplex ultrasound were performed of bilateral carotid and vertebral arteries in the neck. COMPARISON:  None. FINDINGS: Criteria: Quantification of carotid stenosis is based on velocity parameters that correlate the residual internal carotid diameter with NASCET-based stenosis levels, using the diameter of the distal internal carotid lumen as the denominator for stenosis measurement. The following velocity measurements were obtained: RIGHT ICA:  448/145 cm/sec CCA:  80/27 cm/sec SYSTOLIC ICA/CCA RATIO:  5.6 DIASTOLIC ICA/CCA RATIO:  5.4 ECA:  306 cm/sec LEFT ICA:  181/56 cm/sec CCA:  129/35 cm/sec SYSTOLIC ICA/CCA RATIO:  1.4 DIASTOLIC ICA/CCA RATIO:  1.6 ECA:  184 cm/sec RIGHT CAROTID ARTERY: There is moderate partially calcified plaque in the carotid bulb and internal carotid artery. Turbulent flow and elevated velocities in the proximal right ICA correspond to an estimated greater than 70% stenosis. RIGHT VERTEBRAL ARTERY: Antegrade flow with normal waveform and velocity. LEFT CAROTID ARTERY: A moderate amount of partially calcified plaque is present at the level of the carotid bulb and proximal internal carotid artery. Based on velocities, estimated left ICA stenosis is 50- 69%. LEFT VERTEBRAL ARTERY: Antegrade flow with normal waveform and velocity. Incidental note of a predominantly solid and partially cystic nodule in the left lobe of the thyroid gland measuring roughly 3.3 x 2.2 x 2.6 cm. IMPRESSION: 1. Evidence of significant proximal right ICA stenosis estimated to be greater than 70% based on  velocity elevation. 2. Evidence of estimated 50- 69% proximal left ICA stenosis. 3. Incidental 3 cm left thyroid nodule. Dedicated thyroid ultrasound is recommended for further evaluation. Electronically Signed   By: Irish Lack M.D.   On: 08/23/2015 11:21      Assessment/Plan 1. High grade right carotid artery stenosis with likely stroke.  MRI pending.  CTA shows typical bifurcation lesion of 80-90%.  Would generally recommend repair for stroke risk reduction in 1-2 weeks to avoid reperfusion injury after a stroke.  She has subclavian disease as well, but this is unlikely the cause of her stroke and not likely a major issue. Have discussed case with Dr. Darrold Junker and agree with getting a nuclear stress test tomorrow. She has a litany of medical issues and may not be a good candidate for surgery/endarterectomy even if her stress test is OK.  Does have anatomy that can be repaired with a stent although her vessels are on the small side.  Will follow up after her stress test to discuss results and determine timing of repair.   2.  Tobacco dependence.  Discussed this as a primary risk factor for atherosclerosis.  Smoking cessation strongly recommended.   3. 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  This is not clearly defined.  Could increase her risk for general anesthesia, but not entirely clear. 4. DM. Pretty advanced and neuropathy is significant. Glucose control important 5. HTN. On outpatient meds. 6. COPD. Significant.  Has been told previously she could not have surgery because of her lungs.  Aydn Ferrara, MD  08/24/2015 3:21 PM

## 2015-08-24 NOTE — Progress Notes (Signed)
   08/24/15 0900  Clinical Encounter Type  Visited With Patient not available  Visit Type Follow-up  Consult/Referral To Chaplain  Attempted follow up visit with patient to complete Advance Directive.  Patient sleeping unable to complete at this time.  Nurse will page me when patient is ready to complete.  Asbury Automotive GroupChaplain [place]-pager [phone]

## 2015-08-24 NOTE — Progress Notes (Signed)
Physical Therapy Treatment Patient Details Name: Denise Bass MRN: [id number] DOB: [birth year] Today's Date: 08/24/2015    History of Present Illness 44 y.o. female with a known history of type 2 diabetes non-insulin-requiring, essential hypertension is presenting with left-sided weakness. She actually presented on 08/19/2015 with acute onset left-sided weakness however she refused admission at that time and subsequently left AGAINST MEDICAL ADVICE. She returns today given persistence of symptoms as well as somewhat worsening. She states now she is unable to and plan given left-sided weakness. She describes left upper and lower extremity weakness with intermittent left-sided facial droop with garbled speech. Currently the speech has improved back to baseline however the left side remains weak.     PT Comments    PT spoke with MD Paraschos (Cardiology) who cleared pt to participate in PT and this MD reported no restrictions for physical therapy.  No c/o chest pain during session.  Pt demonstrates improved balance and gait mechanics using RW today (compared to yesterday).  Limited session d/t pt reporting just getting bad news from Vascular MD (may need to have surgery in a few weeks per pt and pt's family) and pt agreed to ambulation with PT d/t pt's sister's encouragement.  Pt appeared a bit impulsive during session but overall improving.  Recommend pt discharge home with 24/7 supervision/assist for safety with day to day tasks and functional mobility; also recommend HHPT.   Follow Up Recommendations  Home health PT;Supervision for mobility/OOB     Equipment Recommendations  Rolling walker with 5" wheels    Recommendations for Other Services       Precautions / Restrictions Precautions Precautions: Fall Restrictions Weight Bearing Restrictions: No    Mobility  Bed Mobility Overal bed mobility: Modified Independent             General bed mobility comments: HOB elevated supine  to/from sit  Transfers Overall transfer level: Modified independent Equipment used: None;Rolling walker (2 wheeled)             General transfer comment: steady without loss of balance  Ambulation/Gait Ambulation/Gait assistance: Supervision Ambulation Distance (Feet): 200 Feet Assistive device: Rolling walker (2 wheeled)   Gait velocity: increased   General Gait Details: pt steady with step through gait pattern; no loss of balance noted although pt appearing impulsive at times   Stairs            Wheelchair Mobility    Modified Rankin (Stroke Patients Only)       Balance Overall balance assessment: Needs assistance Sitting-balance support: No upper extremity supported;Feet supported Sitting balance-Leahy Scale: Normal     Standing balance support: Bilateral upper extremity supported (on RW) Standing balance-Leahy Scale: Good                      Cognition Arousal/Alertness: Awake/alert Behavior During Therapy: Impulsive Overall Cognitive Status: Within Functional Limits for tasks assessed                      Exercises      General Comments   Nursing cleared pt for participation in physical therapy.  Pt agreeable to PT session with encouragement from her sister.      Pertinent Vitals/Pain Pain Assessment: No/denies pain  Vitals stable and WFL throughout treatment session.    Home Living Family/patient expects to be discharged to:: Private residence Living Arrangements:  (roomate who helps with dressing and bathing) Available Help at Discharge: Friend(s) Type of Home:  Apartment Home Access: Stairs to enter Entrance Stairs-Rails: None Home Layout: One level Home Equipment: Walker - 4 wheels;Cane - single point      Prior Function Level of Independence: Needs assistance          PT Goals (current goals can now be found in the care plan section) Acute Rehab PT Goals Patient Stated Goal: to go home PT Goal Formulation: With  patient Time For Goal Achievement: 09/07/15 Potential to Achieve Goals: Good Progress towards PT goals: Progressing toward goals    Frequency  7X/week    PT Plan Discharge plan needs to be updated    Co-evaluation             End of Session Equipment Utilized During Treatment: Gait belt Activity Tolerance: Patient tolerated treatment well Patient left: in bed;with call bell/phone within reach;with bed alarm set;with family/visitor present     Time: 3244-0102 PT Time Calculation (min) (ACUTE ONLY): 14 min  Charges:  $Gait Training: 8-22 mins                    G CodesHendricks Limes 2015/09/03, 3:29 PM Hendricks Limes, PT [phone]

## 2015-08-24 NOTE — Plan of Care (Signed)
Problem: Discharge/Transitional Outcomes Goal: Other Discharge Outcomes/Goals Outcome: Progressing Plan of Care Progress to Goal:    Pt report pain once during shift. Pt neuro function is improving. Pt was found outside the [facility] during the shift and was reminded of the importance of not leaving the unit. Pt verbalize understanding and report she wont do it again. No other signs of distress noted. Will continue to monitor.

## 2015-08-24 NOTE — Evaluation (Signed)
Occupational Therapy Evaluation Patient Details Name: Denise Bass MRN: [id number] DOB: 06/06/1971 Today's Date: 08/24/2015    History of Present Illness This patient  Is a 44 year old  female who came to [facility] with a known history of type 2 diabetes non-insulin-requiring, essential hypertension is presenting with left-sided weakness. She  presented on 08/19/2015 with acute onset left-sided weakness but left AGAINST MEDICAL ADVICE. She returned  As symptoms became worse.     Clinical Impression   This patient is a 44 year old female who came to [facility] with the above history. She lives in apartment with a roommate who assists her with dressing and bathing.  There are no steps. Her bathroom has a tub shower combo but there is no equipment. She has some sensory deficits in left hand and history of numbness in both lower extremities. She would benefit from Occupational Therapy for ADL/functioal mobility training with safety.     Follow Up Recommendations       Equipment Recommendations       Recommendations for Other Services       Precautions / Restrictions Precautions Precautions: Fall Restrictions Weight Bearing Restrictions: No      Mobility Bed Mobility Overal bed mobility: Modified Independent               Transfers                Balance                                ADL                                         General ADL Comments: Patient had been getting assist for dressing and bathing. She had been toileting herself.      Vision     Perception     Praxis      Pertinent Vitals/Pain Pain Assessment: No/denies pain     Hand Dominance     Extremity/Trunk Assessment Upper Extremity Assessment Upper Extremity Assessment:  (R UE 5/5 through out L UE 4+/5 through out - diminished light touch, sharp, and temp. on left) LUE Deficits / Details: Unable to assess  fine motor today   Lower Extremity Assessment Lower Extremity Assessment: Defer to PT evaluation       Communication     Cognition Arousal/Alertness: Awake/alert Behavior During Therapy: Impulsive Overall Cognitive Status: Within Functional Limits for tasks assessed                     General Comments       Exercises       Shoulder Instructions      Home Living Family/patient expects to be discharged to:: Private residence Living Arrangements:  (roomate who helps with dressing and bathing) Available Help at Discharge: Friend(s) Type of Home: Apartment Home Access: Stairs to enter Entergy CorporationEntrance Stairs-Number of Steps: 2 Entrance Stairs-Rails: None Home Layout: One level     Bathroom Shower/Tub: Tub/shower unit         Home Equipment: Environmental consultantWalker - 4 wheels;Cane - single point          Prior Functioning/Environment Level of Independence: Needs assistance             OT Diagnosis: Paresis  OT Problem List:     OT Treatment/Interventions: Self-care/ADL training;Neuromuscular education    OT Goals(Current goals can be found in the care plan section) Acute Rehab OT Goals Patient Stated Goal: to go home Time For Goal Achievement: 09/07/15 Potential to Achieve Goals: Good  OT Frequency: Min 1X/week   Barriers to D/C:            Co-evaluation              End of Session    Activity Tolerance:   Patient left: in bed;with call bell/phone within reach;with bed alarm set;with family/visitor present   Time: 1150-1210 OT Time Calculation (min): 20 min Charges:  OT General Charges $OT Visit: 1 Procedure OT Evaluation $Initial OT Evaluation Tier I: 1 Procedure G-Codes: OT G-codes **NOT FOR INPATIENT CLASS** Functional Assessment Tool Used: clinical judgement Functional Limitation: Self care Self Care Current Status (Z6109): At least 40 percent but less than 60 percent impaired, limited or restricted Self Care Goal Status (U0454): At least 20  percent but less than 40 percent impaired, limited or restricted  Gwyndolyn Kaufman, MS/OTR/L  08/24/2015, 4:21 PM

## 2015-08-24 NOTE — Evaluation (Signed)
Occupational Therapy Evaluation Patient Details Name: Denise Bass MRN: [id number] DOB: 11/30/1970 Today's Date: 08/24/2015    History of Present Illness 44 y.o. female with a known history of type 2 diabetes non-insulin-requiring, essential hypertension is presenting with left-sided weakness. She actually presented on 08/19/2015 with acute onset left-sided weakness however she refused admission at that time and subsequently left AGAINST MEDICAL ADVICE. She returns today given persistence of symptoms as well as somewhat worsening. She states now she is unable to and plan given left-sided weakness. She describes left upper and lower extremity weakness with intermittent left-sided facial droop with garbled speech. Currently the speech has improved back to baseline however the left side remains weak.    Clinical Impression   44 year old female who came to [facility] with the above history.    Follow Up Recommendations       Equipment Recommendations       Recommendations for Other Services       Precautions / Restrictions Precautions Precautions: Fall Restrictions Weight Bearing Restrictions: No      Mobility Bed Mobility Overal bed mobility: Independent             General bed mobility comments: supine to sit and sit to supine  Transfers                      Balance                                            ADL                                         General ADL Comments: Patient had been getting assist for dressing and bathing     Vision     Perception     Praxis      Pertinent Vitals/Pain Pain Assessment: No/denies pain     Hand Dominance     Extremity/Trunk Assessment Upper Extremity Assessment Upper Extremity Assessment:  (R UE 5/5 through out L UE 4+/5 through out - diminished light touch, sharp, and temp. on left) LUE Deficits / Details: Unable to assess fine motor today    Lower Extremity Assessment Lower Extremity Assessment: Defer to PT evaluation       Communication     Cognition Arousal/Alertness:  (somewhat sleepy)                         General Comments       Exercises       Shoulder Instructions      Home Living Family/patient expects to be discharged to:: Private residence Living Arrangements:  (roomate who helps with dressing and bathing) Available Help at Discharge: Friend(s) Type of Home: Apartment Home Access: Stairs to enter [employer] CorporationEntrance Stairs-Number of Steps: 2 Entrance Stairs-Rails: None Home Layout: One level     Bathroom Shower/Tub: Tub/shower unit         Home Equipment: Environmental consultantWalker - 4 wheels;Cane - single point          Prior Functioning/Environment Level of Independence: Needs assistance             OT Diagnosis: Paresis   OT Problem List:     OT Treatment/Interventions: Self-care/ADL  training;Neuromuscular education    OT Goals(Current goals can be found in the care plan section) Acute Rehab OT Goals Patient Stated Goal: would rather go home Time For Goal Achievement: 09/07/15 Potential to Achieve Goals: Good  OT Frequency: Min 1X/week   Barriers to D/C:            Co-evaluation              End of Session    Activity Tolerance:   Patient left: in bed;with call bell/phone within reach;with bed alarm set;with family/visitor present   Time: 1150-1210 OT Time Calculation (min): 20 min Charges:  OT General Charges $OT Visit: 1 Procedure OT Evaluation $Initial OT Evaluation Tier I: 1 Procedure G-Codes:    Ocie Cornfield 09-04-15, 2:18 PM

## 2015-08-24 NOTE — Consult Note (Signed)
[facility]  CARDIOLOGY CONSULT NOTE  Patient ID: Denise Bass MRN: [id number] DOB/AGE: [birth year] 42 y.o.  Admit date: 08/22/2015 Referring Physician Elisabeth Pigeon Primary Physician Jerl Mina M.D. Primary Cardiologist  Reason for Consultation chest pain  HPI: The patient is a 44 year old female admitted with CVA, referred for evaluation chest pain. The patient's had a recent history of intermittent left-sided weakness, presented to [facility] emergency room 08/19/15 time she refused admission. The patient returned on 08/22/59 persistent left-sided weakness and left facial droop worrisome for CVA. Ultrasound revealed 70% stenosis right internal carotid artery. CT angio revealed greater  than 80% stenosis right internal carotid artery, with 70% stenosis in both subclavian arteries. Patient complains of mild residual weakness on her left side. In addition, the patient's had it 3-4 week history of intermittent episodes of chest discomfort which occur with or without exertion, described as a heaviness sensation. ECG on admission revealed normal sinus rhythm normal ECG. The patient currently is on aspirin and metoprolol tartrate. She smokes approximately 2-3 packs of cigarettes per day. The patient has multiple cardiovascular risk factors including type 2 diabetes and essential hypertension, in addition to smoking.  Review of systems complete and found to be negative unless listed above     Past Medical History  Diagnosis Date  . Diabetes mellitus without complication (HCC)   . Anxiety   . Arthritis   . COPD (chronic obstructive pulmonary disease) (HCC)   . Emphysema of lung (HCC)   . GERD (gastroesophageal reflux disease)   . Hypertension   . 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 Neuromuscular disorder (HCC)   . Osteoarthritis of both knees   . AC (acromioclavicular) joint bone spurs     knees  . Stroke (HCC)   . Substance abuse   . Genetic blood cell  disorder     Rare/unknown/still researching at [place]/  . Polyclonal gammopathy   . Elevated sed rate   . Leukocytosis   . Idiopathic neuropathy   . HLD (hyperlipidemia)   . Folliculitis   . Lumbago   . Sensory urge incontinence     Past Surgical History  Procedure Laterality Date  . Cholecystectomy    . Dilation and curettage of uterus    . Tonsilectomy, adenoidectomy, bilateral myringotomy and tubes    . Corneal transplant      Prescriptions prior to admission  Medication Sig Dispense Refill Last Dose  . albuterol (PROVENTIL HFA;VENTOLIN HFA) 108 (90 BASE) MCG/ACT inhaler Inhale 1-2 puffs into the lungs every 6 (six) hours as needed for wheezing or shortness of breath.   PRN at PRN  . aspirin EC 81 MG tablet Take 81 mg by mouth daily.   08/22/2015 at 0800  . clonazePAM (KLONOPIN) 0.5 MG tablet Take 0.5 mg by mouth 2 (two) times daily as needed for anxiety.   08/22/2015 at Unknown time  . cyclobenzaprine (FLEXERIL) 5 MG tablet Take 5 mg by mouth at bedtime.   08/21/2015 at Unknown time  . DULoxetine (CYMBALTA) 60 MG capsule Take 60 mg by mouth daily.    08/22/2015 at Unknown time  . lisinopril (PRINIVIL,ZESTRIL) 20 MG tablet Take 20 mg by mouth daily.    08/22/2015 at Unknown time  . Melatonin 2.5 MG CAPS Take 2.5 mg by mouth at bedtime.   08/21/2015 at Unknown time  . metoprolol tartrate (LOPRESSOR) 25 MG tablet Take 25 mg by mouth 2 (two) times daily.   08/22/2015  at 0800  . OVER THE COUNTER MEDICATION Take 1 tablet by mouth at bedtime. Pt takes Hyland's Restful Legs.   08/21/2015 at Unknown time  . sitaGLIPtin (JANUVIA) 100 MG tablet Take 100 mg by mouth daily.   08/22/2015 at Unknown time   Social History   Social History  . Marital Status: Legally Separated    Spouse Name: N/A  . Number of Children: N/A  . Years of Education: N/A   Occupational History  . Not on file.   Social History Main Topics  . Smoking status: Current Every Day Smoker -- 2.00 packs/day for  29 years    Types: Cigarettes  . Smokeless tobacco: Never Used     Comment: usine nicotine patches  . Alcohol Use: No  . Drug Use: 7.00 per week    Special: Marijuana     Comment: Uses for arthritis relief at night before bed  . Sexual Activity:    Partners: Male    Pharmacist, hospital Protection: None     Comment: 5 miscarriages   Other Topics Concern  . Not on file   Social History Narrative    Family History  Problem Relation Age of Onset  . Stroke Mother   . COPD Mother   . Emphysema Mother   . Asthma Mother   . Liver disease Father   . Stroke Father     cerebral hemrhage  . Tuberculosis Father   . Mental illness Father   . Mental illness Brother   . Heart disease Father       Review of systems complete and found to be negative unless listed above      PHYSICAL EXAM  General: Well developed, well nourished, in no acute distress HEENT:  Normocephalic and atramatic Neck:  No JVD.  Lungs: Clear bilaterally to auscultation and percussion. Heart: HRRR . Normal S1 and S2 without gallops or murmurs.  Abdomen: Bowel sounds are positive, abdomen soft and non-tender  Msk:  Back normal, normal gait. Normal strength and tone for age. Extremities: No clubbing, cyanosis or edema.   Neuro: Alert and oriented X 3. Psych:  Good affect, responds appropriately  Labs:   Lab Results  Component Value Date   WBC 10.6 08/23/2015   HGB 12.2 08/23/2015   HCT 38.8 08/23/2015   MCV 74.3* 08/23/2015   PLT 401 08/23/2015    Recent Labs Lab 08/19/15 2125 08/22/15 1610 08/23/15 0647  NA 133* 137  --   K 3.7 3.9  --   CL 105 103  --   CO2 23 27  --   BUN 15 8  --   CREATININE 1.00 0.71 0.79  CALCIUM 9.1 9.2  --   PROT 7.6  --   --   BILITOT 0.5  --   --   ALKPHOS 111  --   --   ALT 16  --   --   AST 15  --   --   GLUCOSE 157* 142*  --    Lab Results  Component Value Date   TROPONINI <0.03 08/19/2015    Lab Results  Component Value Date   CHOL 141 08/22/2015    Lab Results  Component Value Date   HDL 27* 08/22/2015   Lab Results  Component Value Date   LDLCALC 89 08/22/2015   Lab Results  Component Value Date   TRIG 123 08/22/2015   Lab Results  Component Value Date   CHOLHDL 5.2 08/22/2015   No results found  for: LDLDIRECT    Radiology: Dg Chest 2 View  08/19/2015  CLINICAL DATA:  Chest pain. EXAM: CHEST  2 VIEW COMPARISON:  July 26, 2014. FINDINGS: The heart size and mediastinal contours are within normal limits. Both lungs are clear. No pneumothorax or pleural effusion is noted. The visualized skeletal structures are unremarkable. IMPRESSION: No active cardiopulmonary disease. Electronically Signed   By: Lupita RaiderJames  Green Jr, M.D.   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 COMPARISON:  07/26/2014 FINDINGS: Normal ventricular morphology. No midline shift or mass effect. Normal appearance of brain parenchyma. No intracranial hemorrhage, mass lesion, or evidence acute infarction. No extra-axial fluid collections. Mucosal thickening RIGHT maxillary sinus. No acute osseous findings. IMPRESSION: No acute intracranial abnormalities. Electronically Signed   By: Ulyses SouthwardMark  Boles M.D.   On: 08/19/2015 21:56   Ct Angio Neck W/cm &/or Wo/cm  08/23/2015  CLINICAL DATA:  Carotid artery stenosis. Left-sided weakness. History of diabetes, hypertension, and hyperlipidemia. EXAM: CT ANGIOGRAPHY NECK TECHNIQUE: Multidetector CT imaging of the neck was performed using the standard protocol during bolus administration of intravenous contrast. Multiplanar CT image reconstructions and MIPs were obtained to evaluate the vascular anatomy.  Carotid stenosis measurements (when applicable) are obtained utilizing NASCET criteria, using the distal internal carotid diameter as the denominator. CONTRAST:  100mL OMNIPAQUE IOHEXOL 350 MG/ML SOLN COMPARISON:  Carotid ultrasound 08/23/2015 FINDINGS: Examination is mildly limited by patient body habitus. Aortic arch: Normal variant aortic arch branching pattern with common origin of the brachiocephalic and left common carotid arteries noted. There is mild calcified plaque in the distal aortic arch. There is also mild to moderate circumferential soft tissue partially visualized about the distal arch and proximal descending thoracic aorta as well as involving the proximal arch vessels. There is approximately 70% stenosis of the proximal right subclavian artery near its origin, and there is also approximately 70% stenosis of the left subclavian artery approximately 1.5 cm beyond its origin. Right carotid system: Common carotid artery is patent with mild wall thickening. There is high-grade stenosis of the internal carotid artery at its origin of greater than 80%. The cervical ICA is diffusely small in caliber overall and slightly smaller in caliber compared to the contralateral side. Left carotid system: Common carotid artery is patent with mild diffuse wall thickening. There is 40% stenosis of the proximal ICA. Mild diffuse soft tissue thickening is present about the ICA as well. Vertebral arteries: Vertebral arteries are patent and equal in size without stenosis. Skeleton: No acute osseous abnormality. Other neck: Evidence of chronic right maxillary sinusitis with mild mucosal thickening currently. Small left mastoid effusion. Heterogeneous thyroid diffusely with asymmetric enlargement of the left lobe corresponding to the 3 cm nodule described on ultrasound earlier today. IMPRESSION: 1. 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 2. 40% proximal left ICA stenosis. 3.  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 4. While these stenoses may reflect atherosclerotic disease given patient's medical history, there is evidence of diffuse wall thickening involving a portion of the aorta as well as proximal arch vessels and carotid arteries and a large vessel vasculitis is a consideration. Electronically Signed   By: Sebastian AcheAllen  Grady M.D.   On:  08/23/2015 17:35   US Carotid Bilateral  08/23/2015  CLINICAL DATA:  CVA, hypertension, syncope, diabetes and tobacco use. EXAM: BILATERAL CAROTID DUPLEX ULTRASOUND TECHNIQUE: Wallace Cullens scale imaging, color Doppler and duplex ultrasound were performed of bilateral carotid and vertebral arteries in the neck. COMPARISON:  None. FINDINGS: Criteria: Quantification of carotid stenosis is based on velocity parameters that correlate the residual internal carotid diameter with NASCET-based stenosis levels, using the diameter of the distal internal carotid lumen as the denominator for stenosis measurement. The following velocity measurements were obtained: RIGHT ICA:  448/145 cm/sec CCA:  80/27 cm/sec SYSTOLIC ICA/CCA RATIO:  5.6 DIASTOLIC ICA/CCA RATIO:  5.4 ECA:  306 cm/sec LEFT ICA:  181/56 cm/sec CCA:  129/35 cm/sec SYSTOLIC ICA/CCA RATIO:  1.4 DIASTOLIC ICA/CCA RATIO:  1.6 ECA:  184 cm/sec RIGHT CAROTID ARTERY: There is moderate partially calcified plaque in the carotid bulb and internal carotid artery. Turbulent flow and elevated velocities in the proximal right ICA correspond to an estimated greater than 70% stenosis. RIGHT VERTEBRAL ARTERY: Antegrade flow with normal waveform and velocity. LEFT CAROTID ARTERY: A moderate amount of partially calcified plaque is present at the level of the carotid bulb and proximal internal carotid artery. Based on velocities, estimated left ICA stenosis is 50- 69%. LEFT VERTEBRAL ARTERY: Antegrade flow with normal waveform and velocity. Incidental note of a predominantly solid and partially cystic nodule in  the left lobe of the thyroid gland measuring roughly 3.3 x 2.2 x 2.6 cm. IMPRESSION: 1. 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 2. Evidence of estimated 50- 69% proximal left ICA stenosis. 3. Incidental 3 cm left thyroid nodule. Dedicated thyroid ultrasound is recommended for further evaluation. Electronically Signed   By: Irish Lack M.D.   On: 08/23/2015 11:21    EKG: Normal sinus rhythm normal ECG  ASSESSMENT AND PLAN:   56. 44 year old female with multiple cardiac vessel risk factors, intermittent episodes of chest discomfort with typical and atypical features, and normal ECG, in the setting of acute CVA 2. 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 Agree with current therapy 2. Add Imdur 30 mg daily 3. Consider Lexiscan sestamibi study to evaluate for possible underlying coronary artery disease, as well as, cardiovascular risk for potential carotid endarterectomy versus carotid stent. We'll wait until patient has vascular surgery evaluation.   SignedMarcina Millard MD,PhD, [facility] 08/24/2015, 1:06 PM

## 2015-08-25 DIAGNOSIS — I639 Cerebral infarction, unspecified: Secondary | ICD-10-CM | POA: Diagnosis not present

## 2015-08-25 LAB — GLUCOSE, CAPILLARY
GLUCOSE-CAPILLARY: 236 mg/dL — AB (ref 65–99)
GLUCOSE-CAPILLARY: 195 mg/dL — AB (ref 65–99)

## 2015-08-25 LAB — HCG, QUANTITATIVE, PREGNANCY: HCG, BETA CHAIN, QUANT, S: 4 m[IU]/mL (ref <5)

## 2015-08-25 MED ORDER — OXYCODONE HCL 5 MG PO TABS: 5.0000 mg | ORAL_TABLET | Freq: Four times a day (QID) | ORAL | Status: DC | PRN

## 2015-08-25 MED ORDER — ISOSORBIDE MONONITRATE ER 30 MG PO TB24: 30.0000 mg | ORAL_TABLET | Freq: Every day | ORAL | Status: AC

## 2015-08-25 MED ORDER — ATORVASTATIN CALCIUM 80 MG PO TABS: 40.0000 mg | ORAL_TABLET | Freq: Every day | ORAL | Status: AC

## 2015-08-25 NOTE — Discharge Summary (Signed)
[facility] - [place] at [facility]   PATIENT NAME: Denise Bass    MR#:  [id number]  DATE OF BIRTH:  June 30, 1971  DATE OF ADMISSION:  08/22/2015 ADMITTING PHYSICIAN: Wyatt Haste, MD  DATE OF DISCHARGE: 08/25/2015  PRIMARY CARE PHYSICIAN: Jerl Mina, MD    ADMISSION DIAGNOSIS:  Cerebral infarction due to unspecified mechanism [I63.9]  DISCHARGE DIAGNOSIS:  Active Problems:   CVA (cerebral infarction)    Internal carotid artery atherosclerosis.  SECONDARY DIAGNOSIS:   Past Medical History  Diagnosis Date  . Diabetes mellitus without complication (HCC)   . Anxiety   . Arthritis   . COPD (chronic obstructive pulmonary disease) (HCC)   . Emphysema of lung (HCC)   . GERD (gastroesophageal reflux disease)   . Hypertension   . 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 Neuromuscular disorder (HCC)   . Osteoarthritis of both knees   . AC (acromioclavicular) joint bone spurs     knees  . Stroke (HCC)   . Substance abuse   . Genetic blood cell disorder     Rare/unknown/still researching at [place]/  . Polyclonal gammopathy   . Elevated sed rate   . Leukocytosis   . Idiopathic neuropathy   . HLD (hyperlipidemia)   . Folliculitis   . Lumbago   . Sensory urge incontinence     HOSPITAL COURSE:   44 year old Caucasian female history of type 2 diabetes non-insulin-requiring complicated by neuropathy who is presenting with left-sided weakness.  1. CVA, unspecified: Aspirin statin therapy, telemetry,   Could not do MRI as she told " she needs sedation for that" and out hospital is not equiped for that.  Advised to get as out pt.  Echo and carotid doppler reviewed- Doppler have > 70% blockage in right ICA.  Called vascular consult. CT angio of neck is done > 80% blockage on Right ICA, with some more blockages in subclavian arteries.  PT suggested, home health services at home on d/c.    Vascular  suggested - no intervention atleast 2 weeks due to acute stroke, we will get her to see cardiologist and pulmonologist in office to get clearance for this vascular surgery meanwhile.   Her sister is present today in room, explained her also about the discharge and follow up plans  2. Type 2 diabetes non-insulin-requiring: Hold oral agents at insulin sliding scale with Accu-Cheks 3. Essential hypertension: Given that onset of symptoms occurred greater than 2 days ago continue her home medications    Added imdur for cardiac reason- also help with BP. 4. Venous thromboembolism prophylactic: Heparin subcutaneous 5. Smoking  Councelled strongly against it, spoke to her sister also. Total time for this is 4 min. 6. Angina  Had also on-off complain of chest pain, as probably she may need vascular intervention and have extensive vascular atherosclerosis on CT angio- so called cardio consult.   Dr. Darrold Junker suggested stress test, but pt want to go home, so advised to follow in office and schedule as out pt.   DISCHARGE CONDITIONS:   Stable.  CONSULTS OBTAINED:  Treatment Team:  Annice Needy, MD Marcina Millard, MD  DRUG ALLERGIES:   Allergies  Allergen Reactions  . Chantix [Varenicline] Hives  . Latex Rash and Other (See Comments)    Reaction:  Burning   . Penicillins Hives and Other (See Comments)    Has patient had a PCN reaction causing immediate rash, facial/tongue/throat swelling,  SOB or lightheadedness with hypotension: No Has patient had a PCN reaction causing severe rash involving mucus membranes or skin necrosis: No Has patient had a PCN reaction that required hospitalization No Has patient had a PCN reaction occurring within the last 10 years: No If all of the above answers are "NO", then may proceed with Cephalosporin use.    DISCHARGE MEDICATIONS:   Current Discharge Medication List    START taking these medications   Details  atorvastatin (LIPITOR) 80 MG tablet  Take 0.5 tablets (40 mg total) by mouth daily at 6 PM. Qty: 30 tablet, Refills: 0    isosorbide mononitrate (IMDUR) 30 MG 24 hr tablet Take 1 tablet (30 mg total) by mouth daily. Qty: 30 tablet, Refills: 0    oxyCODONE (OXY IR/ROXICODONE) 5 MG immediate release tablet Take 1 tablet (5 mg total) by mouth every 6 (six) hours as needed for moderate pain. Qty: 20 tablet, Refills: 0      CONTINUE these medications which have NOT CHANGED   Details  albuterol (PROVENTIL HFA;VENTOLIN HFA) 108 (90 BASE) MCG/ACT inhaler Inhale 1-2 puffs into the lungs every 6 (six) hours as needed for wheezing or shortness of breath.    aspirin EC 81 MG tablet Take 81 mg by mouth daily.    clonazePAM (KLONOPIN) 0.5 MG tablet Take 0.5 mg by mouth 2 (two) times daily as needed for anxiety.    cyclobenzaprine (FLEXERIL) 5 MG tablet Take 5 mg by mouth at bedtime.    DULoxetine (CYMBALTA) 60 MG capsule Take 60 mg by mouth daily.     lisinopril (PRINIVIL,ZESTRIL) 20 MG tablet Take 20 mg by mouth daily.     Melatonin 2.5 MG CAPS Take 2.5 mg by mouth at bedtime.    metoprolol tartrate (LOPRESSOR) 25 MG tablet Take 25 mg by mouth 2 (two) times daily.    sitaGLIPtin (JANUVIA) 100 MG tablet Take 100 mg by mouth daily.      STOP taking these medications     OVER THE COUNTER MEDICATION          DISCHARGE INSTRUCTIONS:   Follow with Cardiology, pulmonary and vascular surgery clinics.  If you experience worsening of your admission symptoms, develop shortness of breath, life threatening emergency, suicidal or homicidal thoughts you must seek medical attention immediately by calling 911 or calling your MD immediately  if symptoms less severe.  You Must read complete instructions/literature along with all the possible adverse reactions/side effects for all the Medicines you take and that have been prescribed to you. Take any new Medicines after you have completely understood and accept all the possible adverse  reactions/side effects.   Please note  You were cared for by a hospitalist during your hospital stay. If you have any questions about your discharge medications or the care you received while you were in the hospital after you are discharged, you can call the unit and asked to speak with the hospitalist on call if the hospitalist that took care of you is not available. Once you are discharged, your primary care physician will handle any further medical issues. Please note that NO REFILLS for any discharge medications will be authorized once you are discharged, as it is imperative that you return to your primary care physician (or establish a relationship with a primary care physician if you do not have one) for your aftercare needs so that they can reassess your need for medications and monitor your lab values.    Today   CHIEF  COMPLAINT:   Chief Complaint  Patient presents with  . Weakness    HISTORY OF PRESENT ILLNESS:  Denise Bass  is a 44 y.o. female with a known history of type 2 diabetes non-insulin-requiring, essential hypertension is presenting with left-sided weakness. She actually presented on 08/19/2015 with acute onset left-sided weakness however she refused admission at that time and subsequently left AGAINST MEDICAL ADVICE. She returns today given persistence of symptoms as well as somewhat worsening. She states now she is unable to and plan given left-sided weakness. She describes left upper and lower extremity weakness with intermittent left-sided facial droop with garbled speech. Currently the speech has improved back to baseline however the left side remains weak.   VITAL SIGNS:  Blood pressure 91/61, pulse 87, temperature 98.7 F (37.1 C), temperature source Oral, resp. rate 20, height 5\' 5"  (1.651 m), weight 99.791 kg (220 lb), last menstrual period 11/29/2014, SpO2 97 %.  I/O:   Intake/Output Summary (Last 24 hours) at 08/25/15 1307 Last data filed at 08/25/15 0732   Gross per 24 hour  Intake    240 ml  Output      0 ml  Net    240 ml    PHYSICAL EXAMINATION:   GENERAL: 44 y.o.-year-old obase patient lying in the bed with no acute distress. Sleepy. EYES: Pupils equal, round, reactive to light. Have cloudy cornea. No scleral icterus. Extraocular muscles intact.  HEENT: Head atraumatic, normocephalic. Oropharynx and nasopharynx clear. Have hearing deficit. NECK: Supple, no jugular venous distention. No thyroid enlargement, no tenderness.  LUNGS: Normal breath sounds bilaterally, no wheezing, rales,rhonchi or crepitation. No use of accessory muscles of respiration.  CARDIOVASCULAR: S1, S2 normal. No murmurs, rubs, or gallops.  ABDOMEN: Soft, nontender, nondistended. Bowel sounds present. No organomegaly or mass.  EXTREMITIES: No pedal edema, cyanosis, or clubbing.  NEUROLOGIC: Cranial nerves II through XII are intact. Muscle strength 5/5 in right side extremities and 4/5 in left side. Sensation intact. Gait not checked.  PSYCHIATRIC: The patient is alert and oriented x 3. But not able to understand the details or seriousness of the disease. SKIN: No obvious rash, lesion, or ulcer.    DATA REVIEW:   CBC  Recent Labs Lab 08/23/15 0647  WBC 10.6  HGB 12.2  HCT 38.8  PLT 401    Chemistries   Recent Labs Lab 08/19/15 2125 08/22/15 1610 08/23/15 0647  NA 133* 137  --   K 3.7 3.9  --   CL 105 103  --   CO2 23 27  --   GLUCOSE 157* 142*  --   BUN 15 8  --   CREATININE 1.00 0.71 0.79  CALCIUM 9.1 9.2  --   AST 15  --   --   ALT 16  --   --   ALKPHOS 111  --   --   BILITOT 0.5  --   --     Cardiac Enzymes  Recent Labs Lab 08/19/15 2125  TROPONINI <0.03    Microbiology Results  Results for orders placed or performed in visit on 08/29/14  Clostridium Difficile [facility])     Status: None   Collection Time: 08/29/14 11:28 PM  Result Value Ref Range Status   Micro Text Report   Final       C.DIFFICILE ANTIGEN        C.DIFFICILE GDH ANTIGEN : NEGATIVE   C.DIFFICILE TOXIN A/B     C.DIFFICILE TOXINS A AND B : NEGATIVE   INTERPRETATION  Negative for C. difficile.    ANTIBIOTIC                                                      Stool culture     Status: None   Collection Time: 08/29/14 11:28 PM  Result Value Ref Range Status   Micro Text Report   Final       COMMENT                   NO SALMONELLA OR SHIGELLA ISOLATED   COMMENT                   NO PATHOGENIC E.COLI DETECTED   COMMENT                   NO CAMPYLOBACTER ANTIGEN DETECTED   ANTIBIOTIC                                                        RADIOLOGY:  Ct Angio Neck W/cm &/or Wo/cm  08/23/2015  CLINICAL DATA:  Carotid artery stenosis. Left-sided weakness. History of diabetes, hypertension, and hyperlipidemia. EXAM: CT ANGIOGRAPHY NECK TECHNIQUE: Multidetector CT imaging of the neck was performed using the standard protocol during bolus administration of intravenous contrast. Multiplanar CT image reconstructions and MIPs were obtained to evaluate the vascular anatomy. Carotid stenosis measurements (when applicable) are obtained utilizing NASCET criteria, using the distal internal carotid diameter as the denominator. CONTRAST:  OMNIPAQUE IOHEXOL 350 MG/ML SOLN COMPARISON:  Carotid ultrasound 08/23/2015 FINDINGS: Examination is mildly limited by patient body habitus. Aortic arch: Normal variant aortic arch branching pattern with common origin of the brachiocephalic and left common carotid arteries noted. There is mild calcified plaque in the distal aortic arch. There is also mild to moderate circumferential soft tissue partially visualized about the distal arch and proximal descending thoracic aorta as well as involving the proximal arch vessels. There is approximately 70% stenosis of the proximal right subclavian artery near its origin, and there is also approximately 70% stenosis of the left subclavian artery approximately 1.5 cm  beyond its origin. Right carotid system: Common carotid artery is patent with mild wall thickening. There is high-grade stenosis of the internal carotid artery at its origin of greater than 80%. The cervical ICA is diffusely small in caliber overall and slightly smaller in caliber compared to the contralateral side. Left carotid system: Common carotid artery is patent with mild diffuse wall thickening. There is 40% stenosis of the proximal ICA. Mild diffuse soft tissue thickening is present about the ICA as well. Vertebral arteries: Vertebral arteries are patent and equal in size without stenosis. Skeleton: No acute osseous abnormality. Other neck: Evidence of chronic right maxillary sinusitis with mild mucosal thickening currently. Small left mastoid effusion. Heterogeneous thyroid diffusely with asymmetric enlargement of the left lobe corresponding to the 3 cm nodule described on ultrasound earlier today. IMPRESSION: 1. 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 2. 40% proximal left ICA stenosis. 3. 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 4. While these stenoses may reflect atherosclerotic disease given patient's medical history, there is evidence of  diffuse wall thickening involving a portion of the aorta as well as proximal arch vessels and carotid arteries and a large vessel vasculitis is a consideration. Electronically Signed   By: Sebastian Ache M.D.   On: 08/23/2015 17:35    Management plans discussed with the patient, family and they are in agreement.  CODE STATUS:     Code Status Orders        Start     Ordered   08/22/15 2034  Full code   Continuous     08/22/15 2033      TOTAL TIME TAKING CARE OF THIS PATIENT: 35 minutes.    Altamese Dilling M.D on 08/25/2015 at 1:07 PM  Between 7am to 6pm - Pager - [phone]  After 6pm go to [email or web address] - password [email or web address]  [place] [facility]  Office   [phone]  CC: Primary care physician; Jerl Mina, MD   Note: This dictation was prepared with Dragon dictation along with smaller phrase technology. Any transcriptional errors that result from this process are unintentional.

## 2015-08-25 NOTE — Progress Notes (Signed)
Discharge instructions and medications discussed with pt.  IVs removed by nursing students.  Pt transported home via car by her sister.  Orson Apeanielle Miroslav Gin, RN

## 2015-08-25 NOTE — Care Management (Signed)
Discharge to home today per Dr. Elisabeth PigeonVachhani.  Physical therapy is recommending home with home health, physical therapy and rolling walker. These services will be arranged thru [facility].  Friend will transport. Gwenette GreetBrenda S Cathleen Yagi RN MSN CCM Care Management [phone]

## 2015-08-25 NOTE — Plan of Care (Signed)
Problem: Discharge/Transitional Outcomes Goal: Barriers To Progression Addressed/Resolved Individualization of Care  Address as Denise Bass. Lives with a female friend. Hx of DM, HTN, COPD, HLD, arthritis, anxiety, AC, CVA, substance abuse. Pt is medically managed.  Outcome: Not Progressing According to Dr Driscilla Grammesew's note pt with 80 % blockage to the right ICA. < 50 % blockage on the left.  Goal: Other Discharge Outcomes/Goals Outcome: Progressing Pt up with standby assist and walker. Ambulated around nurses station. Pt had to be redirected to remain on the unit. Numerous complaints of leg pain and headache. Morphine given twice so far this shift and Oxycodone given once. NIH score of 4. Pt scheduled for Myoview today.

## 2015-09-15 ENCOUNTER — Other Ambulatory Visit: Payer: Self-pay | Admitting: Vascular Surgery

## 2015-09-16 ENCOUNTER — Other Ambulatory Visit
Admission: RE | Admit: 2015-09-16 | Discharge: 2015-09-16 | Disposition: A | Discharge status: Home / Self Care | Payer: Medicare Other | Source: Ambulatory Visit | Attending: Vascular Surgery | Admitting: Vascular Surgery

## 2015-09-16 DIAGNOSIS — I6521 Occlusion and stenosis of right carotid artery: Secondary | ICD-10-CM | POA: Insufficient documentation

## 2015-09-16 DIAGNOSIS — Z01812 Encounter for preprocedural laboratory examination: Secondary | ICD-10-CM | POA: Insufficient documentation

## 2015-09-16 LAB — CREATININE, SERUM
Creatinine, Ser: 1.42 mg/dL — ABNORMAL HIGH (ref 0.44–1.00)
GFR calc Af Amer: 52 mL/min — ABNORMAL LOW (ref >60)
GFR calc non Af Amer: 45 mL/min — ABNORMAL LOW (ref >60)

## 2015-09-16 LAB — BUN: BUN: 18 mg/dL (ref 6–20)

## 2015-09-19 ENCOUNTER — Ambulatory Visit: Admission: RE | Admit: 2015-09-19 | Payer: Medicare Other | Source: Ambulatory Visit | Admitting: Vascular Surgery

## 2015-09-19 ENCOUNTER — Encounter
Admission: RE | Disposition: A | Discharge status: Home / Self Care | Payer: Self-pay | Source: Ambulatory Visit | Attending: Vascular Surgery

## 2015-09-19 ENCOUNTER — Inpatient Hospital Stay
Admission: RE | Admit: 2015-09-19 | Discharge: 2015-09-20 | DRG: 035 | Disposition: A | Discharge status: Home / Self Care | Payer: Medicare Other | Source: Ambulatory Visit | Attending: Vascular Surgery | Admitting: Vascular Surgery

## 2015-09-19 ENCOUNTER — Encounter: Admission: RE | Payer: Self-pay | Source: Ambulatory Visit

## 2015-09-19 DIAGNOSIS — Z880 Allergy status to penicillin: Secondary | ICD-10-CM

## 2015-09-19 DIAGNOSIS — M170 Bilateral primary osteoarthritis of knee: Secondary | ICD-10-CM | POA: Diagnosis present

## 2015-09-19 DIAGNOSIS — Z79899 Other long term (current) drug therapy: Secondary | ICD-10-CM | POA: Diagnosis not present

## 2015-09-19 DIAGNOSIS — I6529 Occlusion and stenosis of unspecified carotid artery: Secondary | ICD-10-CM | POA: Diagnosis present

## 2015-09-19 DIAGNOSIS — K219 Gastro-esophageal reflux disease without esophagitis: Secondary | ICD-10-CM | POA: Diagnosis present

## 2015-09-19 DIAGNOSIS — Z825 Family history of asthma and other chronic lower respiratory diseases: Secondary | ICD-10-CM | POA: Diagnosis not present

## 2015-09-19 DIAGNOSIS — N189 Chronic kidney disease, unspecified: Secondary | ICD-10-CM | POA: Diagnosis present

## 2015-09-19 DIAGNOSIS — E1122 Type 2 diabetes mellitus with diabetic chronic kidney disease: Secondary | ICD-10-CM | POA: Diagnosis present

## 2015-09-19 DIAGNOSIS — Z8673 Personal history of transient ischemic attack (TIA), and cerebral infarction without residual deficits: Secondary | ICD-10-CM | POA: Diagnosis not present

## 2015-09-19 DIAGNOSIS — I6521 Occlusion and stenosis of right carotid artery: Principal | ICD-10-CM | POA: Diagnosis present

## 2015-09-19 DIAGNOSIS — Z91040 Latex allergy status: Secondary | ICD-10-CM | POA: Diagnosis not present

## 2015-09-19 DIAGNOSIS — J449 Chronic obstructive pulmonary disease, unspecified: Secondary | ICD-10-CM | POA: Diagnosis present

## 2015-09-19 DIAGNOSIS — N289 Disorder of kidney and ureter, unspecified: Secondary | ICD-10-CM | POA: Diagnosis present

## 2015-09-19 DIAGNOSIS — E7400 Glycogen storage disease, unspecified: Secondary | ICD-10-CM | POA: Diagnosis present

## 2015-09-19 DIAGNOSIS — Z8249 Family history of ischemic heart disease and other diseases of the circulatory system: Secondary | ICD-10-CM

## 2015-09-19 DIAGNOSIS — I739 Peripheral vascular disease, unspecified: Secondary | ICD-10-CM | POA: Diagnosis present

## 2015-09-19 DIAGNOSIS — I129 Hypertensive chronic kidney disease with stage 1 through stage 4 chronic kidney disease, or unspecified chronic kidney disease: Secondary | ICD-10-CM | POA: Diagnosis present

## 2015-09-19 DIAGNOSIS — F17200 Nicotine dependence, unspecified, uncomplicated: Secondary | ICD-10-CM | POA: Diagnosis present

## 2015-09-19 HISTORY — PX: PERIPHERAL VASCULAR CATHETERIZATION: SHX172C

## 2015-09-19 LAB — BASIC METABOLIC PANEL
ANION GAP: 9 (ref 5–15)
BUN: 12 mg/dL (ref 6–20)
CALCIUM: 10.3 mg/dL (ref 8.9–10.3)
CO2: 28 mmol/L (ref 22–32)
CREATININE: 1.13 mg/dL — AB (ref 0.44–1.00)
Chloride: 103 mmol/L (ref 101–111)
GFR, EST NON AFRICAN AMERICAN: 59 mL/min — AB (ref >60)
Glucose, Bld: 89 mg/dL (ref 65–99)
Potassium: 5.3 mmol/L — ABNORMAL HIGH (ref 3.5–5.1)
SODIUM: 140 mmol/L (ref 135–145)

## 2015-09-19 LAB — GLUCOSE, CAPILLARY
GLUCOSE-CAPILLARY: 82 mg/dL (ref 65–99)
GLUCOSE-CAPILLARY: 83 mg/dL (ref 65–99)
GLUCOSE-CAPILLARY: 107 mg/dL — AB (ref 65–99)

## 2015-09-19 LAB — HCG, QUANTITATIVE, PREGNANCY: hCG, Beta Chain, Quant, S: 5 m[IU]/mL — ABNORMAL HIGH (ref <5)

## 2015-09-19 SURGERY — RENAL ANGIOGRAPHY
Anesthesia: Moderate Sedation

## 2015-09-19 SURGERY — CAROTID PTA/STENT INTERVENTION
Anesthesia: Moderate Sedation | Laterality: Right

## 2015-09-19 MED ORDER — ATROPINE SULFATE 0.1 MG/ML IJ SOLN
INTRAMUSCULAR | Status: AC
  Filled 2015-09-19: qty 10

## 2015-09-19 MED ORDER — FAMOTIDINE IN NACL 20-0.9 MG/50ML-% IV SOLN
20.0000 mg | Freq: Two times a day (BID) | INTRAVENOUS | Status: DC
  Administered 2015-09-19 – 2015-09-20 (×3): 20 mg via INTRAVENOUS
  Filled 2015-09-19 (×4): qty 50

## 2015-09-19 MED ORDER — DIPHENHYDRAMINE HCL 50 MG/ML IJ SOLN
INTRAMUSCULAR | Status: AC
  Filled 2015-09-19: qty 1

## 2015-09-19 MED ORDER — LISINOPRIL 10 MG PO TABS
20.0000 mg | ORAL_TABLET | Freq: Every day | ORAL | Status: DC
  Administered 2015-09-20: 20 mg via ORAL
  Filled 2015-09-19: qty 2

## 2015-09-19 MED ORDER — TRAMADOL HCL 50 MG PO TABS
50.0000 mg | ORAL_TABLET | Freq: Two times a day (BID) | ORAL | Status: DC | PRN
  Administered 2015-09-20: 50 mg via ORAL
  Filled 2015-09-19: qty 1

## 2015-09-19 MED ORDER — FENTANYL CITRATE (PF) 100 MCG/2ML IJ SOLN
INTRAMUSCULAR | Status: DC | PRN
Start: 2015-09-19 — End: 2015-09-19
  Administered 2015-09-19 (×3): 50 ug via INTRAVENOUS

## 2015-09-19 MED ORDER — OXYCODONE HCL 5 MG PO TABS
5.0000 mg | ORAL_TABLET | Freq: Four times a day (QID) | ORAL | Status: DC | PRN
  Administered 2015-09-19: 5 mg via ORAL
  Filled 2015-09-19: qty 1

## 2015-09-19 MED ORDER — SODIUM CHLORIDE 0.9 % IV SOLN: 500.0000 mL | Freq: Once | INTRAVENOUS | Status: DC | PRN

## 2015-09-19 MED ORDER — CYCLOBENZAPRINE HCL 10 MG PO TABS
5.0000 mg | ORAL_TABLET | Freq: Every day | ORAL | Status: DC
  Administered 2015-09-19: 5 mg via ORAL
  Filled 2015-09-19: qty 1

## 2015-09-19 MED ORDER — ACETAMINOPHEN 325 MG RE SUPP: 325.0000 mg | RECTAL | Status: DC | PRN

## 2015-09-19 MED ORDER — HEPARIN SODIUM (PORCINE) 1000 UNIT/ML IJ SOLN
INTRAMUSCULAR | Status: DC | PRN
  Administered 2015-09-19: 1000 [IU] via INTRAVENOUS
  Administered 2015-09-19: 2000 [IU] via INTRAVENOUS
  Administered 2015-09-19: 7000 [IU] via INTRAVENOUS

## 2015-09-19 MED ORDER — CLINDAMYCIN PHOSPHATE 300 MG/50ML IV SOLN
300.0000 mg | Freq: Once | INTRAVENOUS | Status: AC
  Administered 2015-09-19: 300 mg via INTRAVENOUS

## 2015-09-19 MED ORDER — ALBUTEROL SULFATE HFA 108 (90 BASE) MCG/ACT IN AERS: 1.0000 | INHALATION_SPRAY | Freq: Four times a day (QID) | RESPIRATORY_TRACT | Status: DC | PRN

## 2015-09-19 MED ORDER — MAGNESIUM SULFATE 2 GM/50ML IV SOLN: 2.0000 g | Freq: Every day | INTRAVENOUS | Status: DC | PRN

## 2015-09-19 MED ORDER — SODIUM BICARBONATE 8.4 % IV SOLN
INTRAVENOUS | Status: AC
  Filled 2015-09-19 (×2): qty 500

## 2015-09-19 MED ORDER — DIPHENHYDRAMINE HCL 50 MG/ML IJ SOLN
INTRAMUSCULAR | Status: DC | PRN
  Administered 2015-09-19: 50 mg via INTRAVENOUS

## 2015-09-19 MED ORDER — SODIUM BICARBONATE BOLUS VIA INFUSION
INTRAVENOUS | Status: AC
  Administered 2015-09-19: 09:00:00 via INTRAVENOUS
  Filled 2015-09-19: qty 1

## 2015-09-19 MED ORDER — DOPAMINE-DEXTROSE 3.2-5 MG/ML-% IV SOLN: 3.0000 ug/kg/min | INTRAVENOUS | Status: DC

## 2015-09-19 MED ORDER — PHENOL 1.4 % MT LIQD: 1.0000 | OROMUCOSAL | Status: DC | PRN

## 2015-09-19 MED ORDER — ALBUTEROL SULFATE (2.5 MG/3ML) 0.083% IN NEBU: 2.5000 mg | INHALATION_SOLUTION | Freq: Four times a day (QID) | RESPIRATORY_TRACT | Status: DC | PRN

## 2015-09-19 MED ORDER — SODIUM CHLORIDE 0.9 % IV SOLN
INTRAVENOUS | Status: DC
  Administered 2015-09-19: 09:00:00 via INTRAVENOUS

## 2015-09-19 MED ORDER — ISOSORBIDE MONONITRATE ER 30 MG PO TB24
30.0000 mg | ORAL_TABLET | Freq: Every day | ORAL | Status: DC
  Administered 2015-09-20: 30 mg via ORAL
  Filled 2015-09-19: qty 1

## 2015-09-19 MED ORDER — HEPARIN (PORCINE) IN NACL 2-0.9 UNIT/ML-% IJ SOLN
INTRAMUSCULAR | Status: AC
  Filled 2015-09-19: qty 1000

## 2015-09-19 MED ORDER — FENTANYL CITRATE (PF) 100 MCG/2ML IJ SOLN
INTRAMUSCULAR | Status: AC
  Filled 2015-09-19: qty 2

## 2015-09-19 MED ORDER — LIDOCAINE-EPINEPHRINE (PF) 1 %-1:200000 IJ SOLN
INTRAMUSCULAR | Status: AC
  Filled 2015-09-19: qty 30

## 2015-09-19 MED ORDER — CLINDAMYCIN PHOSPHATE 300 MG/50ML IV SOLN
INTRAVENOUS | Status: AC
  Filled 2015-09-19: qty 50

## 2015-09-19 MED ORDER — ATORVASTATIN CALCIUM 20 MG PO TABS
40.0000 mg | ORAL_TABLET | Freq: Every day | ORAL | Status: DC
  Administered 2015-09-19: 40 mg via ORAL
  Filled 2015-09-19: qty 1
  Filled 2015-09-19: qty 2

## 2015-09-19 MED ORDER — IOHEXOL 300 MG/ML  SOLN
INTRAMUSCULAR | Status: DC | PRN
  Administered 2015-09-19: 55 mL via INTRAVENOUS

## 2015-09-19 MED ORDER — GUAIFENESIN-DM 100-10 MG/5ML PO SYRP: 15.0000 mL | ORAL_SOLUTION | ORAL | Status: DC | PRN

## 2015-09-19 MED ORDER — ATROPINE SULFATE 0.4 MG/ML IJ SOLN
INTRAMUSCULAR | Status: DC | PRN
  Administered 2015-09-19: 0.4 mg via INTRAVENOUS

## 2015-09-19 MED ORDER — ONDANSETRON HCL 4 MG/2ML IJ SOLN: 4.0000 mg | Freq: Four times a day (QID) | INTRAMUSCULAR | Status: DC | PRN

## 2015-09-19 MED ORDER — MORPHINE SULFATE (PF) 4 MG/ML IV SOLN
2.0000 mg | INTRAVENOUS | Status: DC | PRN
  Administered 2015-09-19 – 2015-09-20 (×5): 4 mg via INTRAVENOUS
  Filled 2015-09-19 (×6): qty 1

## 2015-09-19 MED ORDER — METOPROLOL TARTRATE 1 MG/ML IV SOLN: 2.0000 mg | INTRAVENOUS | Status: DC | PRN

## 2015-09-19 MED ORDER — ACETAMINOPHEN 325 MG PO TABS: 325.0000 mg | ORAL_TABLET | ORAL | Status: DC | PRN

## 2015-09-19 MED ORDER — MIDAZOLAM HCL 2 MG/2ML IJ SOLN
INTRAMUSCULAR | Status: DC | PRN
  Administered 2015-09-19: 1 mg via INTRAVENOUS
  Administered 2015-09-19: 2 mg via INTRAVENOUS

## 2015-09-19 MED ORDER — ASPIRIN EC 81 MG PO TBEC
81.0000 mg | DELAYED_RELEASE_TABLET | Freq: Every day | ORAL | Status: DC
  Administered 2015-09-20: 81 mg via ORAL
  Filled 2015-09-19: qty 1

## 2015-09-19 MED ORDER — CLOPIDOGREL BISULFATE 75 MG PO TABS
75.0000 mg | ORAL_TABLET | Freq: Every day | ORAL | Status: DC
  Administered 2015-09-20: 75 mg via ORAL
  Filled 2015-09-19: qty 1

## 2015-09-19 MED ORDER — GLIPIZIDE ER 10 MG PO TB24
10.0000 mg | ORAL_TABLET | Freq: Two times a day (BID) | ORAL | Status: DC
  Administered 2015-09-20: 10 mg via ORAL
  Filled 2015-09-19 (×3): qty 1

## 2015-09-19 MED ORDER — SODIUM CHLORIDE 0.9 % IV SOLN
INTRAVENOUS | Status: DC
  Administered 2015-09-19: 13:00:00 via INTRAVENOUS

## 2015-09-19 MED ORDER — HEPARIN SODIUM (PORCINE) 1000 UNIT/ML IJ SOLN
INTRAMUSCULAR | Status: AC
  Filled 2015-09-19: qty 1

## 2015-09-19 MED ORDER — METOPROLOL TARTRATE 25 MG PO TABS
25.0000 mg | ORAL_TABLET | Freq: Two times a day (BID) | ORAL | Status: DC
  Administered 2015-09-20: 25 mg via ORAL
  Filled 2015-09-19: qty 1

## 2015-09-19 MED ORDER — DULOXETINE HCL 60 MG PO CPEP
60.0000 mg | ORAL_CAPSULE | Freq: Every day | ORAL | Status: DC
  Administered 2015-09-20: 60 mg via ORAL
  Filled 2015-09-19: qty 1

## 2015-09-19 MED ORDER — ALUM & MAG HYDROXIDE-SIMETH 200-200-20 MG/5ML PO SUSP: 15.0000 mL | ORAL | Status: DC | PRN

## 2015-09-19 MED ORDER — MIDAZOLAM HCL 5 MG/5ML IJ SOLN
INTRAMUSCULAR | Status: AC
  Filled 2015-09-19: qty 5

## 2015-09-19 MED ORDER — VANCOMYCIN HCL IN DEXTROSE 1-5 GM/200ML-% IV SOLN
1000.0000 mg | Freq: Two times a day (BID) | INTRAVENOUS | Status: AC
  Administered 2015-09-19 (×2): 1000 mg via INTRAVENOUS
  Filled 2015-09-19 (×2): qty 200

## 2015-09-19 MED ORDER — LINAGLIPTIN 5 MG PO TABS
5.0000 mg | ORAL_TABLET | Freq: Every day | ORAL | Status: DC
  Administered 2015-09-20: 5 mg via ORAL
  Filled 2015-09-19: qty 1

## 2015-09-19 MED ORDER — POTASSIUM CHLORIDE CRYS ER 20 MEQ PO TBCR: 20.0000 meq | EXTENDED_RELEASE_TABLET | Freq: Every day | ORAL | Status: DC | PRN

## 2015-09-19 MED ORDER — INSULIN ASPART 100 UNIT/ML ~~LOC~~ SOLN
0.0000 [IU] | SUBCUTANEOUS | Status: DC
Start: 2015-09-19 — End: 2015-09-19

## 2015-09-19 MED ORDER — CLONAZEPAM 0.5 MG PO TABS: 0.5000 mg | ORAL_TABLET | Freq: Two times a day (BID) | ORAL | Status: DC | PRN

## 2015-09-19 MED ORDER — MELATONIN 2.5 MG PO CAPS: 2.5000 mg | ORAL_CAPSULE | Freq: Every day | ORAL | Status: DC

## 2015-09-19 SURGICAL SUPPLY — 15 items
BALLN VIATRAC 5X20X135 (BALLOONS) ×3
BALLOON VIATRAC 5X20X135 (BALLOONS) ×1 IMPLANT
CATH ANGIO 5F 100CM .035 PIG (CATHETERS) ×3 IMPLANT
CATH H1 100CM (CATHETERS) ×3 IMPLANT
DEVICE EMBOSHIELD NAV6 4.0-7.0 (WIRE) ×3 IMPLANT
DEVICE PRESTO INFLATION (MISCELLANEOUS) ×3 IMPLANT
DEVICE STARCLOSE SE CLOSURE (Vascular Products) ×3 IMPLANT
DEVICE TORQUE (MISCELLANEOUS) ×3 IMPLANT
GLIDEWIRE ANGLED SS 035X260CM (WIRE) ×3 IMPLANT
PACK ANGIOGRAPHY (CUSTOM PROCEDURE TRAY) ×3 IMPLANT
SHEATH BRITE TIP 6FRX11 (SHEATH) ×3 IMPLANT
SHEATH SHUTTLE SELECT 6F (SHEATH) ×3 IMPLANT
STENT XACT CAR 9-7X30X136 (Permanent Stent) ×3 IMPLANT
WIRE AMPLATZ SSTIFF .035X260CM (WIRE) ×3 IMPLANT
WIRE J 3MM .035X145CM (WIRE) ×3 IMPLANT

## 2015-09-19 NOTE — H&P (Signed)
  [facility] History & Physical Update  The patient was interviewed and re-examined.  The patient's previous History and Physical has been reviewed and is unchanged.  There is no change in the plan of care. We plan to proceed with the scheduled procedure.  Amparo Donalson, MD  09/19/2015, 9:16 AM

## 2015-09-19 NOTE — Progress Notes (Signed)
Patient resting quietly in CCU No neurologic changes. Access site C/D/I HR and BP stable Watch in CCU overnight Check labs in am 

## 2015-09-19 NOTE — Op Note (Signed)
OPERATIVE NOTE   PROCEDURE: 1.  ultrasound guidance for vascular access right femoral artery 2.  Placement of a 9 mm proximal 7 mm distal 30 mm long tapered Exact stent with the use of the NAV-6 embolic protection device in the right carotid artery  PRE-OPERATIVE DIAGNOSIS: 1. High-grade right carotid artery stenosis. 2. Previous stroke 3. Undefined glycogen storage disease 4. Tobacco dependence 5. Peripheral arterial disease with claudication bilateral lower extremities  POST-OPERATIVE DIAGNOSIS:  Same as above  SURGEON: Festus BarrenJason Jianna Drabik, MD  ASSISTANT(S):  None  ANESTHESIA: local/MCS  ESTIMATED BLOOD LOSS:  25 cc  FINDING(S): 1.   85% right carotid artery stenosis  CONTRAST: 55 cc  SPECIMEN(S):   none  INDICATIONS:   Patient is a 44 year old female who presents with  high-grade right carotid artery stenosis.  The patient has a previous history of stroke and a litany of medical issues. She is diagnosed with some undefined glycogen storage disease that may make general anesthesia very high risk as well as a large number of other issues and carotid artery stenting was felt to be preferred to endarterectomy for that reason.  Risks and benefits were discussed and informed consent was obtained.   DESCRIPTION: After obtaining full informed written consent, the patient was brought back to the vascular suite and placed supine upon the table.  The patient received IV antibiotics prior to induction.  After obtaining adequate anesthesia, the patient was prepped and draped in the standard fashion.   The right femoral artery was visualized with ultrasound and found to be widely patent. It was then accessed under direct ultrasound guidance without difficulty with a Seldinger needle. A permanent image was recorded. A J-wire was placed and we then placed a 6 French sheath. The patient was then heparinized and a total of 9000 units of intravenous heparin were given and an ACT was checked to confirm  successful anticoagulation. A pigtail catheter was then placed into the ascending aorta. This showed a bovine configuration of the great vessels. I then selectively cannulated the innominate artery without difficulty with a headhunter catheter and advanced into the mid right common carotid artery.  Cervical and cerebral carotid angiography was then performed. There were no obvious intracranial filling defects with somewhat sluggish flow in the anterior cerebral artery. The carotid bifurcation demonstrated about an 85% focal stenosis of less than 10 mm.  I then advanced into the external carotid artery with a Glidewire and the headhunter catheter and then exchanged for the Amplatz Super Stiff wire. Over the Amplatz Super Stiff wire, a 6 [place]French shuttle sheath was placed into the mid common carotid artery. I then used the NAV-6  Embolic protection device and crossed the lesion and parked this in the distal internal carotid artery at the base of the skull.  I then selected a 9 mm diameter proximal 7 mm diameter distal 30 mm long Exact stent. This was deployed across the lesion encompassing it in its entirety. A 5 mm diameter by 2 cm length balloon was used to post dilate the stent. Only about a 10 % residual stenosis was present after angioplasty. Completion angiogram showed normal intracranial filling without new defects and more brisk flow was seen in the anterior cerebral artery. At this point I elected to terminate the procedure. The sheath was removed and StarClose closure device was deployed in the right femoral artery with excellent hemostatic result. The patient was taken to the recovery room in stable condition having tolerated the procedure well.  COMPLICATIONS: none  CONDITION: stable  Owen Pagnotta 09/19/2015 10:25 AM

## 2015-09-20 ENCOUNTER — Encounter: Payer: Self-pay | Admitting: Vascular Surgery

## 2015-09-20 LAB — CBC
HCT: 37.8 % (ref 35.0–47.0)
Hemoglobin: 11.8 g/dL — ABNORMAL LOW (ref 12.0–16.0)
MCH: 23.8 pg — AB (ref 26.0–34.0)
MCHC: 31.2 g/dL — AB (ref 32.0–36.0)
MCV: 76.2 fL — AB (ref 80.0–100.0)
PLATELETS: 292 10*3/uL (ref 150–440)
RBC: 4.96 MIL/uL (ref 3.80–5.20)
RDW: 17 % — AB (ref 11.5–14.5)
WBC: 11 10*3/uL (ref 3.6–11.0)

## 2015-09-20 LAB — BASIC METABOLIC PANEL
Anion gap: 5 (ref 5–15)
BUN: 11 mg/dL (ref 6–20)
CHLORIDE: 104 mmol/L (ref 101–111)
CO2: 27 mmol/L (ref 22–32)
CREATININE: 1.16 mg/dL — AB (ref 0.44–1.00)
Calcium: 9.4 mg/dL (ref 8.9–10.3)
GFR calc Af Amer: >60 mL/min (ref >60)
GFR calc non Af Amer: 57 mL/min — ABNORMAL LOW (ref >60)
Glucose, Bld: 141 mg/dL — ABNORMAL HIGH (ref 65–99)
Potassium: 4.5 mmol/L (ref 3.5–5.1)
Sodium: 136 mmol/L (ref 135–145)

## 2015-09-20 LAB — MRSA PCR SCREENING: MRSA by PCR: NEGATIVE

## 2015-09-20 MED ORDER — OXYCODONE HCL 5 MG PO TABS: 5.0000 mg | ORAL_TABLET | Freq: Four times a day (QID) | ORAL | Status: AC | PRN

## 2015-09-20 NOTE — Discharge Summary (Signed)
[facility]    Discharge Summary    Patient ID:  Denise Bass MRN: [id number] DOB/AGE: [birth year] 44 y.o.  Admit date: 09/19/2015 Discharge date: 09/20/2015 Date of Surgery: 09/19/2015 Surgeon: Surgeon(s): Annice Needy, MD  Admission Diagnosis: Rt Carotid Stent Placement Carotid Stenosis ABBOTT REP  Discharge Diagnoses:  Rt Carotid Stent Placement Carotid Stenosis ABBOTT REP  Secondary Diagnoses: Past Medical History  Diagnosis Date  . Diabetes mellitus without complication (HCC)   . Anxiety   . Arthritis   . COPD (chronic obstructive pulmonary disease) (HCC)   . Emphysema of lung (HCC)   . GERD (gastroesophageal reflux disease)   . Hypertension   . 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 Neuromuscular disorder (HCC)   . Osteoarthritis of both knees   . AC (acromioclavicular) joint bone spurs     knees  . Stroke (HCC)   . Substance abuse   . Genetic blood cell disorder     Rare/unknown/still researching at [place]/  . Polyclonal gammopathy   . Elevated sed rate   . Leukocytosis   . Idiopathic neuropathy   . HLD (hyperlipidemia)   . Folliculitis   . Lumbago   . Sensory urge incontinence     Procedure(s): Carotid PTA/Stent Intervention  Discharged Condition: good  HPI:  Previous stroke, high grade right ICA stenosis, high risk patient with glycogen storage disease  Hospital Course:  Denise Bass is a 44 y.o. female is S/P right Procedure(s): Carotid PTA/Stent Intervention  Physical exam: neuro exam normal Post-op wounds incision C/D/I Pt. Ambulating, voiding and taking PO diet without difficulty. Pt pain controlled with PO pain meds. Labs as below Complications:none  Consults:     Significant Diagnostic Studies: CBC Lab Results  Component Value Date   WBC 11.0 09/20/2015   HGB 11.8* 09/20/2015   HCT 37.8 09/20/2015   MCV 76.2* 09/20/2015   PLT 292 09/20/2015    BMET     Component Value Date/Time   NA 136 09/20/2015 0632   NA 137 08/31/2014 0415   K 4.5 09/20/2015 0632   K 3.8 08/31/2014 0415   CL 104 09/20/2015 0632   CL 104 08/31/2014 0415   CO2 27 09/20/2015 0632   CO2 29 08/31/2014 0415   GLUCOSE 141* 09/20/2015 0632   GLUCOSE 149* 08/31/2014 0415   BUN 11 09/20/2015 0632   BUN 11 08/31/2014 0415   CREATININE 1.16* 09/20/2015 0632   CREATININE 1.33* 08/31/2014 0415   CALCIUM 9.4 09/20/2015 0632   CALCIUM 8.5 08/31/2014 0415   GFRNONAA 57* 09/20/2015 0632   GFRNONAA 40* 08/30/2014 0412   GFRNONAA >60 01/27/2014 1613   GFRAA >60 09/20/2015 0632   GFRAA 49* 08/30/2014 0412   GFRAA >60 01/27/2014 1613   COAG Lab Results  Component Value Date   INR 1.06 08/19/2015     Disposition:  Discharge to home    Medication List    TAKE these medications        albuterol 108 (90 BASE) MCG/ACT inhaler  Commonly known as:  PROVENTIL HFA;VENTOLIN HFA  Inhale 1-2 puffs into the lungs every 6 (six) hours as needed for wheezing or shortness of breath.     aspirin EC 81 MG tablet  Take 81 mg by mouth daily.     atorvastatin 80 MG tablet  Commonly known as:  LIPITOR  Take 0.5 tablets (40 mg total) by mouth daily at 6  PM.     clonazePAM 0.5 MG tablet  Commonly known as:  KLONOPIN  Take 0.5 mg by mouth 2 (two) times daily as needed for anxiety.     clopidogrel 75 MG tablet  Commonly known as:  PLAVIX  Take 75 mg by mouth daily.     cyclobenzaprine 5 MG tablet  Commonly known as:  FLEXERIL  Take 5 mg by mouth at bedtime.     DULoxetine 60 MG capsule  Commonly known as:  CYMBALTA  Take 60 mg by mouth daily.     glipiZIDE 10 MG 24 hr tablet  Commonly known as:  GLUCOTROL XL  Take 10 mg by mouth 2 (two) times daily.     isosorbide mononitrate 30 MG 24 hr tablet  Commonly known as:  IMDUR  Take 1 tablet (30 mg total) by mouth daily.     lisinopril 20 MG tablet  Commonly known as:  PRINIVIL,ZESTRIL  Take 20 mg by mouth daily.      metoprolol tartrate 25 MG tablet  Commonly known as:  LOPRESSOR  Take 25 mg by mouth 2 (two) times daily.     oxyCODONE 5 MG immediate release tablet  Commonly known as:  Oxy IR/ROXICODONE  Take 1 tablet (5 mg total) by mouth every 6 (six) hours as needed for moderate pain.     sitaGLIPtin 100 MG tablet  Commonly known as:  JANUVIA  Take 100 mg by mouth daily.     traMADol 50 MG tablet  Commonly known as:  ULTRAM  Take 50 mg by mouth every 12 (twelve) hours as needed for moderate pain or severe pain.       Verbal and written Discharge instructions given to the patient. Wound care per Discharge AVS     Follow-up Information    Follow up with Ishanvi Mcquitty, MD In 3 weeks.   Specialties:  Vascular Surgery, Radiology, Interventional Cardiology   Why:  with carotid duplex   Contact information:   [address] [place] [address] [phone]       Signed: Festus Barren, MD  09/20/2015, 8:51 AM

## 2015-09-23 ENCOUNTER — Emergency Department
Admission: EM | Admit: 2015-09-23 | Discharge: 2015-09-23 | Disposition: A | Discharge status: Home / Self Care | Payer: Medicare Other | Attending: Emergency Medicine | Admitting: Emergency Medicine

## 2015-09-23 ENCOUNTER — Encounter: Payer: Self-pay | Admitting: Emergency Medicine

## 2015-09-23 DIAGNOSIS — E1140 Type 2 diabetes mellitus with diabetic neuropathy, unspecified: Secondary | ICD-10-CM | POA: Diagnosis not present

## 2015-09-23 DIAGNOSIS — Z79899 Other long term (current) drug therapy: Secondary | ICD-10-CM | POA: Insufficient documentation

## 2015-09-23 DIAGNOSIS — H5412 Blindness, left eye, low vision right eye: Secondary | ICD-10-CM | POA: Diagnosis not present

## 2015-09-23 DIAGNOSIS — I129 Hypertensive chronic kidney disease with stage 1 through stage 4 chronic kidney disease, or unspecified chronic kidney disease: Secondary | ICD-10-CM | POA: Diagnosis not present

## 2015-09-23 DIAGNOSIS — R270 Ataxia, unspecified: Secondary | ICD-10-CM | POA: Diagnosis not present

## 2015-09-23 DIAGNOSIS — R251 Tremor, unspecified: Secondary | ICD-10-CM

## 2015-09-23 DIAGNOSIS — F17210 Nicotine dependence, cigarettes, uncomplicated: Secondary | ICD-10-CM | POA: Insufficient documentation

## 2015-09-23 DIAGNOSIS — Z91040 Latex allergy status: Secondary | ICD-10-CM | POA: Insufficient documentation

## 2015-09-23 DIAGNOSIS — Z7982 Long term (current) use of aspirin: Secondary | ICD-10-CM | POA: Diagnosis not present

## 2015-09-23 DIAGNOSIS — N189 Chronic kidney disease, unspecified: Secondary | ICD-10-CM | POA: Diagnosis not present

## 2015-09-23 DIAGNOSIS — R253 Fasciculation: Secondary | ICD-10-CM | POA: Diagnosis not present

## 2015-09-23 DIAGNOSIS — Z880 Allergy status to penicillin: Secondary | ICD-10-CM | POA: Diagnosis not present

## 2015-09-23 LAB — URINALYSIS COMPLETE WITH MICROSCOPIC (ARMC ONLY)
BACTERIA UA: NONE SEEN
BILIRUBIN URINE: NEGATIVE
GLUCOSE, UA: NEGATIVE mg/dL
Hgb urine dipstick: NEGATIVE
Ketones, ur: NEGATIVE mg/dL
Leukocytes, UA: NEGATIVE
NITRITE: NEGATIVE
Protein, ur: NEGATIVE mg/dL
RBC / HPF: NONE SEEN RBC/hpf (ref 0–5)
SPECIFIC GRAVITY, URINE: 1.016 (ref 1.005–1.030)
pH: 7 (ref 5.0–8.0)

## 2015-09-23 LAB — BASIC METABOLIC PANEL
Anion gap: 9 (ref 5–15)
BUN: 9 mg/dL (ref 6–20)
CALCIUM: 9.4 mg/dL (ref 8.9–10.3)
CO2: 23 mmol/L (ref 22–32)
CREATININE: 0.9 mg/dL (ref 0.44–1.00)
Chloride: 105 mmol/L (ref 101–111)
GFR calc Af Amer: >60 mL/min (ref >60)
GLUCOSE: 90 mg/dL (ref 65–99)
Potassium: 4.2 mmol/L (ref 3.5–5.1)
SODIUM: 137 mmol/L (ref 135–145)

## 2015-09-23 LAB — CBC WITH DIFFERENTIAL/PLATELET
BASOS ABS: 0.1 10*3/uL (ref 0–0.1)
BASOS PCT: 1 %
EOS ABS: 0.4 10*3/uL (ref 0–0.7)
EOS PCT: 2 %
HCT: 38.4 % (ref 35.0–47.0)
Hemoglobin: 12.3 g/dL (ref 12.0–16.0)
Lymphocytes Relative: 14 %
Lymphs Abs: 2.4 10*3/uL (ref 1.0–3.6)
MCH: 23.7 pg — ABNORMAL LOW (ref 26.0–34.0)
MCHC: 32 g/dL (ref 32.0–36.0)
MCV: 74 fL — AB (ref 80.0–100.0)
MONO ABS: 1.2 10*3/uL — AB (ref 0.2–0.9)
Monocytes Relative: 7 %
Neutro Abs: 13 10*3/uL — ABNORMAL HIGH (ref 1.4–6.5)
Neutrophils Relative %: 76 %
PLATELETS: 409 10*3/uL (ref 150–440)
RBC: 5.19 MIL/uL (ref 3.80–5.20)
RDW: 16.6 % — AB (ref 11.5–14.5)
WBC: 17 10*3/uL — AB (ref 3.6–11.0)

## 2015-09-23 LAB — CK: CK TOTAL: 22 U/L — AB (ref 38–234)

## 2015-09-23 MED ORDER — DIAZEPAM 5 MG PO TABS
5.0000 mg | ORAL_TABLET | Freq: Once | ORAL | Status: AC
  Administered 2015-09-23: 5 mg via ORAL
  Filled 2015-09-23: qty 1

## 2015-09-23 MED ORDER — LORAZEPAM 2 MG/ML IJ SOLN
1.0000 mg | Freq: Once | INTRAMUSCULAR | Status: AC
  Administered 2015-09-23: 1 mg via INTRAVENOUS

## 2015-09-23 MED ORDER — LORAZEPAM 2 MG/ML IJ SOLN
INTRAMUSCULAR | Status: AC
  Administered 2015-09-23: 1 mg via INTRAVENOUS
  Filled 2015-09-23: qty 1

## 2015-09-23 MED ORDER — DULOXETINE HCL 60 MG PO CPEP: 60.0000 mg | ORAL_CAPSULE | Freq: Every day | ORAL | Status: AC

## 2015-09-23 MED ORDER — DIAZEPAM 5 MG PO TABS: 5.0000 mg | ORAL_TABLET | Freq: Three times a day (TID) | ORAL | Status: AC | PRN

## 2015-09-23 MED ORDER — LORAZEPAM 2 MG/ML IJ SOLN
1.0000 mg | Freq: Once | INTRAMUSCULAR | Status: AC
  Administered 2015-09-23: 1 mg via INTRAVENOUS
  Filled 2015-09-23: qty 1

## 2015-09-23 NOTE — ED Notes (Signed)
Patient presents to the ED with tremors to her left arm.  Patient is alert and oriented x 4 at this time.  Reports this has occurred previously and she has seen a neurologist regarding this condition.  Neurologist suggested increased exercise.  Patient recently had a stent placed in her right carotid artery by Dr. Wyn Quakerew (1 week prior to today).  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  Patient is very hard of hearing.  Patient has a history of a stroke that affected her left side.

## 2015-09-23 NOTE — ED Notes (Signed)
Dr. Shaune PollackLord in room at this time to assess patient.  Will continue to monitor.

## 2015-09-23 NOTE — ED Notes (Signed)
Resumed care from [facility] rn.  Pt alert.  nsr on monitor.  Skin warm and dry.  Friends in with pt.  Pt unable to void at this time.

## 2015-09-23 NOTE — Progress Notes (Signed)
Neurology: 44 y.o. female who is here for evaluation of muscle twitching/tremor. She has reportedly had this problem before but is often times under decent control.  This has been going on for months. Pt does see Dr. Malvin JohnsPotter as out pt. 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 They are not rhythmic which change in amplitude and duration.  Pt is able to speak during these episodes.    Would treat to PRN diazepam.  Do not think these are seizures.  [facility]

## 2015-09-23 NOTE — ED Notes (Signed)
esingnature signed by sister.  Iv dc'ed.  D/c inst to family.  md at bedside at length explaining discharge inst.

## 2015-09-23 NOTE — ED Provider Notes (Addendum)
[facility] Emergency Department Provider Note   ____________________________________________  Time seen: Upon EMS arrival I have reviewed the triage vital signs and the triage nursing note.  HISTORY  Chief Complaint Tremors   Historian Patient  HPI Denise Bass is a 44 y.o. female who is here for evaluation of muscle twitching/tremor.  She has reportedly had this problem before but is often times under decent control. She is apparently seeing a neurologist, Dr. Malvin JohnsPotter who is aware of this symptom but family states he has not seen it.  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  She does state that she's had a history of stroke in the past. No recent illness, but did have right carotid artery stent placed by vascular surgeon one week ago. She's had no change in medications.  Left neck and left arm are twitching. Left leg has been twitching. Right leg has been twitching. No chest pain or shortness of breath. No headache. No new weakness or numbness.  No altered mental status, facial twitching, or new weakness or numbness.  Some ataxia with left arm, but able to move despite twitching.    Past Medical History  Diagnosis Date  . Diabetes mellitus without complication (HCC)   . Anxiety   . Arthritis   . COPD (chronic obstructive pulmonary disease) (HCC)   . Emphysema of lung (HCC)   . GERD (gastroesophageal reflux disease)   . Hypertension   . 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 Neuromuscular disorder (HCC)   . Osteoarthritis of both knees   . AC (acromioclavicular) joint bone spurs     knees  . Stroke (HCC)   . Substance abuse   . Genetic blood cell disorder     Rare/unknown/still researching at [place]/  . Polyclonal gammopathy   . Elevated sed rate   . Leukocytosis   . Idiopathic neuropathy   . HLD (hyperlipidemia)   . Folliculitis   . Lumbago   . Sensory urge incontinence      Patient Active Problem List   Diagnosis Date Noted  . Carotid stenosis 09/19/2015  . TIA (transient ischemic attack) 08/19/2015  . Acute on chronic kidney failure (HCC) 06/14/2015  . COPD (chronic obstructive pulmonary disease) (HCC) 03/05/2013  . Diabetes mellitus (HCC) 03/05/2013  . Nephropathy 03/05/2013  . Diabetic neuropathy (HCC) 03/05/2013  . CVA (cerebral infarction) 03/05/2013  . Essential hypertension, benign 03/05/2013  . Anxiety 03/05/2013  . Arthritis 03/05/2013  . GERD (gastroesophageal reflux disease) 03/05/2013  . 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 Tobacco use disorder, continuous 03/05/2013  . Marijuana smoker (HCC) 03/05/2013  . Menorrhagia 03/05/2013  . Obesity, Class III, BMI 40-49.9 (morbid obesity) (HCC) 03/05/2013  . History of PCOS 03/05/2013  . Dysmenorrhea 03/05/2013    Past Surgical History  Procedure Laterality Date  . Cholecystectomy    . Dilation and curettage of uterus    . Tonsilectomy, adenoidectomy, bilateral myringotomy and tubes    . Corneal transplant    . Peripheral vascular catheterization Right 09/19/2015    Procedure: Carotid PTA/Stent Intervention;  Surgeon: Annice NeedyJason S Dew, MD;  Location: [facility] INVASIVE CV LAB;  Service: Cardiovascular;  Laterality: Right;    Current Outpatient Rx  Name  Route  Sig  Dispense  Refill  . albuterol (PROVENTIL HFA;VENTOLIN HFA) 108 (90 BASE) MCG/ACT inhaler   Inhalation   Inhale  1-2 puffs into the lungs every 6 (six) hours as needed for wheezing or shortness of breath.         Marland Kitchen aspirin EC 81 MG tablet   Oral   Take 81 mg by mouth daily.         Marland Kitchen atorvastatin (LIPITOR) 80 MG tablet   Oral   Take 0.5 tablets (40 mg total) by mouth daily at 6 PM.   30 tablet   0   . clonazePAM (KLONOPIN) 0.5 MG tablet   Oral   Take 0.5 mg by mouth 2 (two) times daily as needed for anxiety.         . clopidogrel (PLAVIX) 75 MG tablet   Oral    Take 75 mg by mouth daily.         . cyclobenzaprine (FLEXERIL) 5 MG tablet   Oral   Take 5 mg by mouth at bedtime.         . diazepam (VALIUM) 5 MG tablet   Oral   Take 1 tablet (5 mg total) by mouth every 8 (eight) hours as needed for muscle spasms.   30 tablet   0   . DULoxetine (CYMBALTA) 60 MG capsule   Oral   Take 60 mg by mouth daily.          Marland Kitchen glipiZIDE (GLUCOTROL XL) 10 MG 24 hr tablet   Oral   Take 10 mg by mouth 2 (two) times daily.         . isosorbide mononitrate (IMDUR) 30 MG 24 hr tablet   Oral   Take 1 tablet (30 mg total) by mouth daily.   30 tablet   0   . lisinopril (PRINIVIL,ZESTRIL) 20 MG tablet   Oral   Take 20 mg by mouth daily.          . metoprolol tartrate (LOPRESSOR) 25 MG tablet   Oral   Take 25 mg by mouth 2 (two) times daily.         Marland Kitchen oxyCODONE (OXY IR/ROXICODONE) 5 MG immediate release tablet   Oral   Take 1 tablet (5 mg total) by mouth every 6 (six) hours as needed for moderate pain.   20 tablet   0   . sitaGLIPtin (JANUVIA) 100 MG tablet   Oral   Take 100 mg by mouth daily.         . traMADol (ULTRAM) 50 MG tablet   Oral   Take 50 mg by mouth every 12 (twelve) hours as needed for moderate pain or severe pain.            Allergies Chantix; Latex; and Penicillins  Family History  Problem Relation Age of Onset  . Stroke Mother   . COPD Mother   . Emphysema Mother   . Asthma Mother   . Liver disease Father   . Stroke Father     cerebral hemrhage  . Tuberculosis Father   . Mental illness Father   . Mental illness Brother   . Heart disease Father     Social History Social History  Substance Use Topics  . Smoking status: Current Every Day Smoker -- 2.00 packs/day for 29 years    Types: Cigarettes  . Smokeless tobacco: Never Used     Comment: usine nicotine patches  . Alcohol Use: No    Review of Systems  Constitutional: Negative for fever. Eyes: Negative for visual changes.blind in left eye  and visually impaired in the right eye.  ENT: Negative for sore throat. Cardiovascular: Negative for chest pain. Respiratory: Negative for shortness of breath. Gastrointestinal: Negative for abdominal pain, vomiting and diarrhea. Genitourinary: Negative for dysuria. Musculoskeletal: Negative for back pain. Skin: Negative for rash. Neurological: Negative for headache. 10 point Review of Systems otherwise negative ____________________________________________   PHYSICAL EXAM:  VITAL SIGNS: ED Triage Vitals  Enc Vitals Group     BP 09/23/15 1301 160/119 mmHg     Pulse Rate 09/23/15 1301 98     Resp 09/23/15 1301 24     Temp 09/23/15 1301 98 F (36.7 C)     Temp Source 09/23/15 1301 Oral     SpO2 09/23/15 1301 95 %     Weight 09/23/15 1301 208 lb (94.348 kg)     Height 09/23/15 1301 5\' 5"  (1.651 m)     Head Cir --      Peak Flow --      Pain Score 09/23/15 1303 0     Pain Loc --      Pain Edu? --      Excl. in GC? --      Constitutional: Alert and oriented. Active twitching, but seems slightly distractable. Eyes: Conjunctivae are normal. PERRL. Normal extraocular movements. ENT   Head: Normocephalic and atraumatic.   Nose: No congestion/rhinnorhea.   Mouth/Throat: Mucous membranes are moist.   Neck: No stridor. Cardiovascular/Chest: Normal rate, regular rhythm.  No murmurs, rubs, or gallops. Respiratory: Normal respiratory effort without tachypnea nor retractions. Breath sounds are clear and equal bilaterally. No wheezes/rales/rhonchi. Gastrointestinal: Soft. No distention, no guarding, no rebound. Nontender. Obese.  Genitourinary/rectal:Deferred Musculoskeletal: left neck, left upper extremity and left lower extremity twitching. Extremely frequent, but not completely rhythmic.  Some ataxia with left hand intentional movements. Neurologic:  Normal speech and language. No facial droop. Hard of hearing. Patient is able to move all extremities voluntarily. Skin:   Skin is warm, dry and intact. No rash noted.   ____________________________________________   EKG I, Governor Rooks, MD, the attending physician have personally viewed and interpreted all ECGs.  No EKG performed ____________________________________________  LABS (pertinent positives/negatives)  White blood count 17.0, hemoglobin 12.3, platelet count 49 Basic metabolic panel within normal limits CK 22 Urinalysis  Negative for infection ____________________________________________  RADIOLOGY All Xrays were viewed by me. Imaging interpreted by Radiologist.  none __________________________________________  PROCEDURES  Procedure(s) performed: None  Critical Care performed: None  ____________________________________________   ED COURSE / ASSESSMENT AND PLAN  CONSULTATIONS: Neurologist - face to face consultation, Dr. Loretha Brasil  Pertinent labs & imaging results that were available during my care of the patient were reviewed by me and considered in my medical decision making (see chart for details).  This patient appears to be having some sort of muscle spasm/twitching. Does not appear to be seizure activity. Does not have a particular cadence rhythm, but is irregular. She has had no altered mental status and is certainly alert and able to give the history. She has some ataxia with use of the left arm especially while there is twitching going on.  Much improved after 1 mg Ativan 3.  I did consult neurology, who saw the patient and does not feel the patient's symptoms are consistent with seizure or acute intracranial process. No recommendation for intracranial imaging.  He recommends Valium by mouth and follow-up with neurology and [facility].  On laboratory evaluation her white blood cell count is elevated at 17, however family states that she typically has a high white blood cell count.  It was lower than this previously, but I am not sure what to make of the elevated white blood cell  count at this point time. Perhaps it is demargination. No additional symptoms for an infectious etiology.  CK normal.  I spoke with PA from Dr. Driscilla Grammes office -- no additional symptoms or concerns that muscle spasm are due to recent vascular procedure.  I discussed the plan with the family, and will add Valium by mouth prescription.  Family does seem somewhat frustrated at these symptoms persisting despite having been evaluated by Dr. Malvin Johns and [facility], and now myself and Dr. Loretha Brasil in the ED.  I have asked them to follow up with Dr. Malvin Johns at next available.  Patient / Family / Caregiver informed of clinical course, medical decision-making process, and agree with plan.   I discussed return precautions, follow-up instructions, and discharged instructions with patient and/or family.   Upon discharge patient stated off cymbalta x few days due to ran out.  Symptoms may be due to abrupt discontinuation of cymbalta?  I will refill home med x 1 month supply of her dose cymbalta. ___________________________________________   FINAL CLINICAL IMPRESSION(S) / ED DIAGNOSES   Final diagnoses:  Muscle twitching       Governor Rooks, MD 09/23/15 1530  Governor Rooks, MD 09/23/15 1610  Governor Rooks, MD 09/23/15 [phone]

## 2015-09-23 NOTE — Discharge Instructions (Signed)
You were evaluated for muscle twitching, for which no certain cause was found but appears to be muscle spasms, of uncertain neurologic cause.  After evaluation with the neurologist, it is recommended that you take Valium as needed to help with muscle twitching.  Follow up with your neurologist and your [facility] at [facility].  Return to the emergency room for any worsening condition including any fever, confusion or altered mental status, new weakness or numbness, or any worsening tremor.   Dystonia Dystonia is a condition that makes your muscles contract without warning (muscle spasms). It can make doing everyday tasks hard. There are different forms of dystonia. The condition can affect just one part of your body, or it can affect larger areas of your body. Dystonia affects people in different ways. In some people, it is mild and goes away over time, while others may need treatment. Although there is no cure for dystonia, you can manage the condition with treatment. CAUSES  Dystonia may be caused by:  Genetics. This means you inherited the genes that cause you to be at risk for dystonia.  An abnormality in the part of your brain that controls movement (basal ganglia). Dystonia may also be acquired. If you have acquired dystonia, you developed the condition after:  Brain injury.  Infection.  Drug reaction. The cause of dystonia may also not be known (idiopathic dystonia).  SIGNS AND SYMPTOMS Signs and symptoms of dystonia can depend on which type of the condition you have. Common signs and symptoms include:  Muscle twitches or spasms around your eyes (blepharospasm).  Foot cramping or dragging.  Pulling of your neck to one side (torticollis).  Muscles spasms of the face.  Spasms of the voice box.  Tremors.  Awkward and painful positions.  Muscle cramping after muscle use. DIAGNOSIS  Your health care provider can diagnose dystonia based on your symptoms and medical history. Your  health care provider will also do a physical exam. You may also have:   A blood test to check for genes that cause dystonia.  Brain imaging tests to rule out other causes of your symptoms. There are no tests that can diagnose other causes of dystonia. TREATMENT  There are no treatments that can cure or prevent dystonia. Treatment to manage dystonia may include:   Injecting the affected muscles with a chemical (botulinum) that blocks muscle spasms. This treatment can block spasms for a few days to a few months.  Medicines to relax muscles. HOME CARE INSTRUCTIONS  Physical therapy to improve muscle strength and movement may be suggested by your health care provider. Continue your physical therapy exercises at home as instructed by your physical therapist.  Make sure you have a good support system. Let your health care provider know if you are struggling with stress or anxiety.  Keep all follow-up visits as directed by your health care provider. This is important.  Take medicines only as directed by your health care provider. SEEK MEDICAL CARE IF:  Your condition is changing or getting worse.  You need more support at home.   This information is not intended to replace advice given to you by your health care provider. Make sure you discuss any questions you have with your health care provider.   Document Released: 10/05/2002 Document Revised: 11/05/2014 Document Reviewed: 12/09/2013 Elsevier Interactive Patient Education [employer]2016 [employer] Inc.

## 2015-09-25 ENCOUNTER — Emergency Department
Admission: EM | Admit: 2015-09-25 | Discharge: 2015-09-25 | Disposition: A | Discharge status: Nursing Home (Includes ICF) | Payer: Medicare Other | Attending: Emergency Medicine | Admitting: Emergency Medicine

## 2015-09-25 ENCOUNTER — Emergency Department: Payer: Medicare Other

## 2015-09-25 DIAGNOSIS — N189 Chronic kidney disease, unspecified: Secondary | ICD-10-CM | POA: Insufficient documentation

## 2015-09-25 DIAGNOSIS — S065X9A Traumatic subdural hemorrhage with loss of consciousness of unspecified duration, initial encounter: Secondary | ICD-10-CM

## 2015-09-25 DIAGNOSIS — Z880 Allergy status to penicillin: Secondary | ICD-10-CM | POA: Insufficient documentation

## 2015-09-25 DIAGNOSIS — I629 Nontraumatic intracranial hemorrhage, unspecified: Secondary | ICD-10-CM | POA: Diagnosis not present

## 2015-09-25 DIAGNOSIS — E1140 Type 2 diabetes mellitus with diabetic neuropathy, unspecified: Secondary | ICD-10-CM | POA: Diagnosis not present

## 2015-09-25 DIAGNOSIS — F17210 Nicotine dependence, cigarettes, uncomplicated: Secondary | ICD-10-CM | POA: Diagnosis not present

## 2015-09-25 DIAGNOSIS — R000 Tachycardia, unspecified: Secondary | ICD-10-CM | POA: Diagnosis not present

## 2015-09-25 DIAGNOSIS — Z7902 Long term (current) use of antithrombotics/antiplatelets: Secondary | ICD-10-CM | POA: Insufficient documentation

## 2015-09-25 DIAGNOSIS — R569 Unspecified convulsions: Secondary | ICD-10-CM | POA: Diagnosis present

## 2015-09-25 DIAGNOSIS — I6200 Nontraumatic subdural hemorrhage, unspecified: Secondary | ICD-10-CM | POA: Insufficient documentation

## 2015-09-25 DIAGNOSIS — J9811 Atelectasis: Secondary | ICD-10-CM | POA: Diagnosis not present

## 2015-09-25 DIAGNOSIS — S065XAA Traumatic subdural hemorrhage with loss of consciousness status unknown, initial encounter: Secondary | ICD-10-CM

## 2015-09-25 DIAGNOSIS — Z79899 Other long term (current) drug therapy: Secondary | ICD-10-CM | POA: Insufficient documentation

## 2015-09-25 DIAGNOSIS — G40901 Epilepsy, unspecified, not intractable, with status epilepticus: Secondary | ICD-10-CM | POA: Diagnosis not present

## 2015-09-25 DIAGNOSIS — Z7982 Long term (current) use of aspirin: Secondary | ICD-10-CM | POA: Diagnosis not present

## 2015-09-25 DIAGNOSIS — Z91040 Latex allergy status: Secondary | ICD-10-CM | POA: Insufficient documentation

## 2015-09-25 DIAGNOSIS — I129 Hypertensive chronic kidney disease with stage 1 through stage 4 chronic kidney disease, or unspecified chronic kidney disease: Secondary | ICD-10-CM | POA: Insufficient documentation

## 2015-09-25 LAB — COMPREHENSIVE METABOLIC PANEL
ALBUMIN: 2.8 g/dL — AB (ref 3.5–5.0)
ALK PHOS: 147 U/L — AB (ref 38–126)
ALT: 26 U/L (ref 14–54)
AST: 36 U/L (ref 15–41)
Anion gap: 9 (ref 5–15)
BILIRUBIN TOTAL: 0.7 mg/dL (ref 0.3–1.2)
BUN: 12 mg/dL (ref 6–20)
CALCIUM: 8.9 mg/dL (ref 8.9–10.3)
CO2: 22 mmol/L (ref 22–32)
Chloride: 103 mmol/L (ref 101–111)
Creatinine, Ser: 0.95 mg/dL (ref 0.44–1.00)
GFR calc Af Amer: >60 mL/min (ref >60)
GLUCOSE: 368 mg/dL — AB (ref 65–99)
POTASSIUM: 4.2 mmol/L (ref 3.5–5.1)
Sodium: 134 mmol/L — ABNORMAL LOW (ref 135–145)
TOTAL PROTEIN: 8.1 g/dL (ref 6.5–8.1)

## 2015-09-25 LAB — CBC WITH DIFFERENTIAL/PLATELET
BASOS ABS: 0.3 10*3/uL — AB (ref 0–0.1)
Basophils Relative: 1 %
EOS PCT: 1 %
Eosinophils Absolute: 0.4 10*3/uL (ref 0–0.7)
HEMATOCRIT: 42.1 % (ref 35.0–47.0)
Hemoglobin: 13.1 g/dL (ref 12.0–16.0)
LYMPHS ABS: 3.2 10*3/uL (ref 1.0–3.6)
LYMPHS PCT: 7 %
MCH: 23.7 pg — AB (ref 26.0–34.0)
MCHC: 31.1 g/dL — ABNORMAL LOW (ref 32.0–36.0)
MCV: 76.1 fL — AB (ref 80.0–100.0)
MONO ABS: 1.5 10*3/uL — AB (ref 0.2–0.9)
Monocytes Relative: 3 %
NEUTROS ABS: 39.7 10*3/uL — AB (ref 1.4–6.5)
Neutrophils Relative %: 88 %
PLATELETS: 552 10*3/uL — AB (ref 150–440)
RBC: 5.53 MIL/uL — AB (ref 3.80–5.20)
RDW: 17.2 % — AB (ref 11.5–14.5)
WBC: 45.1 10*3/uL — ABNORMAL HIGH (ref 3.6–11.0)

## 2015-09-25 LAB — BLOOD GAS, ARTERIAL
ALLENS TEST (PASS/FAIL): POSITIVE — AB
Acid-base deficit: 7.2 mmol/L — ABNORMAL HIGH (ref 0.0–2.0)
BICARBONATE: 19.7 meq/L — AB (ref 21.0–28.0)
FIO2: 0.99
LHR: 14 {breaths}/min
MECHVT: 500 mL
O2 Saturation: 87.8 %
PEEP/CPAP: 5 cmH2O
Patient temperature: 37
pCO2 arterial: 44 mmHg (ref 32.0–48.0)
pH, Arterial: 7.26 — ABNORMAL LOW (ref 7.350–7.450)
pO2, Arterial: 63 mmHg — ABNORMAL LOW (ref 83.0–108.0)

## 2015-09-25 LAB — TROPONIN I: TROPONIN I: 0.14 ng/mL — AB (ref <0.031)

## 2015-09-25 LAB — GLUCOSE, CAPILLARY: Glucose-Capillary: 294 mg/dL — ABNORMAL HIGH (ref 65–99)

## 2015-09-25 MED ORDER — LABETALOL HCL 5 MG/ML IV SOLN
10.0000 mg | Freq: Once | INTRAVENOUS | Status: AC
  Administered 2015-09-25: 10 mg via INTRAVENOUS

## 2015-09-25 MED ORDER — LORAZEPAM 2 MG/ML IJ SOLN
INTRAMUSCULAR | Status: AC
  Administered 2015-09-25: 2 mg via INTRAVENOUS
  Filled 2015-09-25: qty 1

## 2015-09-25 MED ORDER — LEVETIRACETAM 500 MG/5ML IV SOLN
1500.0000 mg | Freq: Once | INTRAVENOUS | Status: AC
  Administered 2015-09-25: 1500 mg via INTRAVENOUS
  Filled 2015-09-25: qty 15

## 2015-09-25 MED ORDER — PROPOFOL 1000 MG/100ML IV EMUL
INTRAVENOUS | Status: AC
  Filled 2015-09-25: qty 100

## 2015-09-25 MED ORDER — MIDAZOLAM HCL 5 MG/5ML IJ SOLN
3.0000 mg | Freq: Once | INTRAMUSCULAR | Status: AC
  Administered 2015-09-25: 3 mg via INTRAVENOUS

## 2015-09-25 MED ORDER — LABETALOL HCL 5 MG/ML IV SOLN
INTRAVENOUS | Status: AC
  Filled 2015-09-25: qty 4

## 2015-09-25 MED ORDER — SODIUM CHLORIDE 0.9 % IV SOLN: 10.0000 mL/h | Freq: Once | INTRAVENOUS | Status: DC

## 2015-09-25 MED ORDER — MIDAZOLAM HCL 5 MG/5ML IJ SOLN
INTRAMUSCULAR | Status: AC
  Administered 2015-09-25: 2 mg via INTRAVENOUS
  Filled 2015-09-25: qty 5

## 2015-09-25 MED ORDER — LORAZEPAM 2 MG/ML IJ SOLN
2.0000 mg | Freq: Once | INTRAMUSCULAR | Status: AC
  Administered 2015-09-25: 2 mg via INTRAVENOUS

## 2015-09-25 MED ORDER — PROPOFOL 1000 MG/100ML IV EMUL
5.0000 ug/kg/min | Freq: Once | INTRAVENOUS | Status: AC
  Administered 2015-09-25: 5 ug/kg/min via INTRAVENOUS
  Administered 2015-09-25: 35 ug/kg/min via INTRAVENOUS
  Filled 2015-09-25: qty 100

## 2015-09-25 MED ORDER — SODIUM CHLORIDE 0.9 % IV BOLUS (SEPSIS)
1000.0000 mL | Freq: Once | INTRAVENOUS | Status: AC
  Administered 2015-09-25: 1000 mL via INTRAVENOUS

## 2015-09-25 MED ORDER — MIDAZOLAM HCL 5 MG/5ML IJ SOLN
2.0000 mg | Freq: Once | INTRAMUSCULAR | Status: AC
  Administered 2015-09-25: 2 mg via INTRAVENOUS

## 2015-09-25 MED ORDER — FENTANYL CITRATE (PF) 100 MCG/2ML IJ SOLN
100.0000 ug | Freq: Once | INTRAMUSCULAR | Status: AC
  Administered 2015-09-25: 100 ug via INTRAVENOUS

## 2015-09-25 NOTE — ED Notes (Signed)
Pt resting in bed. Family at bedside, explained procedure for transport and room number and telephone number for nurse given to sister.  Propofol drip maintained at 35 mcg/kg/min. Pt appears adequately sedated at this time.

## 2015-09-25 NOTE — ED Notes (Addendum)
Pt on ventilator, per night shift report, patient was found by roommate/significant other unresponsive in bed. Pt arrived with flailing arms and posturing. Pt had snoring respirations on arrival.

## 2015-09-25 NOTE — ED Provider Notes (Signed)
Patient's blood pressure has trended down nicely with only 1 dose of labetalol. She remains appropriate for transfer to [facility], still waiting for helicopter.  Denise Everyobert Braylynn Ghan, MD 09/25/15 1006

## 2015-09-25 NOTE — ED Notes (Signed)
CBG [id number] 

## 2015-09-25 NOTE — ED Notes (Addendum)
2nd bottle of propofol started so when [facility] not run out of medication en-route.

## 2015-09-25 NOTE — ED Notes (Signed)
Transported to CT with RN and RT at bedside.

## 2015-09-25 NOTE — ED Notes (Signed)
Dr. Carollee MassedKaminski notified that patient is twitching and jerking in bed that is not severe, but enough to be a noticeable change over the past hour.  Order given to start propofol drip.  Will titrate as needed.

## 2015-09-25 NOTE — ED Provider Notes (Signed)
[facility] Emergency Department Provider Note  ____________________________________________  Time seen: On arrival at 6: 20 5 AM  I have reviewed the triage vital signs and the nursing notes.  History by:  Received from EMS. Patient unresponsive. Level V caveat  HISTORY  Chief Complaint Seizures    HPI Denise Bass is a 44 y.o. female who was found to be unresponsive in her bed at the early hours of the morning. 911 was called and EMS found her in what appeared to be possible seizure-like activity. She was bringing both arms up and clenching and was snoring. She appears to be having some pedal spasm as well as. The patient was treated with Narcan due to what appear to be her unresponsiveness, but she had no response. EMS called while in route and spoke with me regarding her condition. They reported she had good radial pulses and was satting okay. They were calling for an order for a benzodiazepine in case it was seizure-like activity. While on the phone they did not have a blood pressure measurement and then, due to their imminent arrival, shows not to provide any medication. She arrived in the emergency department with ongoing seizure-like activity and sonorous respirations.  It is unclear when this patient's seizure-like activity began. She was last seen last night before bed. At this time, we considered to be in status epilepticus.  The patient has been seen in the emergency department recently. She was seen on Friday and seen by neurology at that time. She was diagnosed with some form of muscle twitching, however it was not considered to be seizure-like.  She did have a leukocytosis on Friday with white blood cell count of 17,000.  No additional history is available currently.   Past Medical History  Diagnosis Date  . Diabetes mellitus without complication ([place])   . Anxiety   . Arthritis   . COPD (chronic obstructive pulmonary disease) ([place])   . Emphysema  of lung ([place])   . GERD (gastroesophageal reflux disease)   . Hypertension   . 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 Neuromuscular disorder ([place])   . Osteoarthritis of both knees   . AC (acromioclavicular) joint bone spurs     knees  . Stroke ([place])   . Substance abuse   . Genetic blood cell disorder     Rare/unknown/still researching at [place]/  . Polyclonal gammopathy   . Elevated sed rate   . Leukocytosis   . Idiopathic neuropathy   . HLD (hyperlipidemia)   . Folliculitis   . Lumbago   . Sensory urge incontinence     Patient Active Problem List   Diagnosis Date Noted  . Carotid stenosis 09/19/2015  . TIA (transient ischemic attack) 08/19/2015  . Acute on chronic kidney failure ([place]) 06/14/2015  . COPD (chronic obstructive pulmonary disease) ([place]) 03/05/2013  . Diabetes mellitus ([place]) 03/05/2013  . Nephropathy 03/05/2013  . Diabetic neuropathy ([place]) 03/05/2013  . CVA (cerebral infarction) 03/05/2013  . Essential hypertension, benign 03/05/2013  . Anxiety 03/05/2013  . Arthritis 03/05/2013  . GERD (gastroesophageal reflux disease) 03/05/2013  . 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 Tobacco use disorder, continuous 03/05/2013  . Marijuana smoker ([place]) 03/05/2013  . Menorrhagia 03/05/2013  . Obesity, Class III, BMI 40-49.9 (morbid obesity) ([place]) 03/05/2013  . History of PCOS 03/05/2013  . Dysmenorrhea 03/05/2013    Past Surgical History  Procedure Laterality Date  . Cholecystectomy    . Dilation and curettage of uterus    . Tonsilectomy, adenoidectomy, bilateral myringotomy and tubes    . Corneal transplant    . Peripheral vascular catheterization Right 09/19/2015    Procedure: Carotid PTA/Stent Intervention;  Surgeon: Algernon Huxley, MD;  Location: [facility];  Service: Cardiovascular;  Laterality: Right;    Current Outpatient Rx  Name  Route  Sig  Dispense  Refill   . albuterol (PROVENTIL HFA;VENTOLIN HFA) 108 (90 BASE) MCG/ACT inhaler   Inhalation   Inhale 1-2 puffs into the lungs every 6 (six) hours as needed for wheezing or shortness of breath.         Marland Kitchen aspirin EC 81 MG tablet   Oral   Take 81 mg by mouth daily.         Marland Kitchen atorvastatin (LIPITOR) 80 MG tablet   Oral   Take 0.5 tablets (40 mg total) by mouth daily at 6 PM.   30 tablet   0   . clonazePAM (KLONOPIN) 0.5 MG tablet   Oral   Take 0.5 mg by mouth 2 (two) times daily as needed for anxiety.         . clopidogrel (PLAVIX) 75 MG tablet   Oral   Take 75 mg by mouth daily.         . cyclobenzaprine (FLEXERIL) 5 MG tablet   Oral   Take 5 mg by mouth at bedtime.         . diazepam (VALIUM) 5 MG tablet   Oral   Take 1 tablet (5 mg total) by mouth every 8 (eight) hours as needed for muscle spasms.   30 tablet   0   . DULoxetine (CYMBALTA) 60 MG capsule   Oral   Take 1 capsule (60 mg total) by mouth daily.   30 capsule   0   . glipiZIDE (GLUCOTROL XL) 10 MG 24 hr tablet   Oral   Take 10 mg by mouth 2 (two) times daily.         . isosorbide mononitrate (IMDUR) 30 MG 24 hr tablet   Oral   Take 1 tablet (30 mg total) by mouth daily.   30 tablet   0   . lisinopril (PRINIVIL,ZESTRIL) 20 MG tablet   Oral   Take 20 mg by mouth daily.          . metoprolol tartrate (LOPRESSOR) 25 MG tablet   Oral   Take 25 mg by mouth 2 (two) times daily.         Marland Kitchen oxyCODONE (OXY IR/ROXICODONE) 5 MG immediate release tablet   Oral   Take 1 tablet (5 mg total) by mouth every 6 (six) hours as needed for moderate pain.   20 tablet   0   . sitaGLIPtin (JANUVIA) 100 MG tablet   Oral   Take 100 mg by mouth daily.         . traMADol (ULTRAM) 50 MG tablet   Oral   Take 50 mg by mouth every 12 (twelve) hours as needed for moderate pain or severe pain.            Allergies Chantix; Latex; and Penicillins  Family History  Problem Relation Age of Onset  . Stroke  Mother   . COPD Mother   . Emphysema Mother   . Asthma Mother   . Liver disease Father   . Stroke Father  cerebral hemrhage  . Tuberculosis Father   . Mental illness Father   . Mental illness Brother   . Heart disease Father     Social History Social History  Substance Use Topics  . Smoking status: Current Every Day Smoker -- 2.00 packs/day for 29 years    Types: Cigarettes  . Smokeless tobacco: Never Used     Comment: usine nicotine patches  . Alcohol Use: No    Review of Systems Review of systems not possible due to patient's seizure-like activity, unresponsive, level V caveat   PHYSICAL EXAM:  VITAL SIGNS: ED Triage Vitals  Enc Vitals Group     BP --      Pulse --      Resp --      Temp --      Temp src --      SpO2 --      Weight --      Height --      Head Cir --      Peak Flow --      Pain Score --      Pain Loc --      Pain Edu? --      Excl. in [place]? --     Constitutional: Patient arrived with seizure-like activity. She had sonorous respirations. Her head was turned to the right. She appeared to have spasms in both legs with pedal flexion. ENT   Head: Normocephalic and atraumatic.   Nose: Appeared normal. I then placed a nasopharyngeal airway into the left nares..       Mouth: Clamp down, initially limited examination. A dentulous on the top. No abnormality noted during intubation.      Eyes: Both eyes appear to have chronic issues with opacities over the cornea. Cardiovascular: Tachycardic at 120s. Some irregularity. Respiratory:  With sonorous respirations.  Breath sounds present on both sides.  Gastrointestinal: Soft, no distention.  Musculoskeletal: Patient with seizure-like activity. She had pedal flexion bilaterally. She had tight muscles throughout. She did not have any deformity noted.  Neurologic:  Seizure-like activity with some occasional flexion of both arms up and backed down. Clenching of hands, pedal flexion. Head turn to the  right. Sonorous respirations.  Skin:  Skin is warm, dry. No rash noted.  ____________________________________________    LABS (pertinent positives/negatives)  Labs Reviewed  BLOOD GAS, ARTERIAL - Abnormal; Notable for the following:    pH, Arterial 7.26 (*)    pO2, Arterial 63 (*)    Bicarbonate 19.7 (*)    Acid-base deficit 7.2 (*)    Allens test (pass/fail) POSITIVE (*)    All other components within normal limits  CBC WITH DIFFERENTIAL/PLATELET - Abnormal; Notable for the following:    WBC 45.1 (*)    RBC 5.53 (*)    MCV 76.1 (*)    MCH 23.7 (*)    MCHC 31.1 (*)    RDW 17.2 (*)    Platelets 552 (*)    Neutro Abs 39.7 (*)    Monocytes Absolute 1.5 (*)    Basophils Absolute 0.3 (*)    All other components within normal limits  GLUCOSE, CAPILLARY - Abnormal; Notable for the following:    Glucose-Capillary 294 (*)    All other components within normal limits  CBC WITH DIFFERENTIAL/PLATELET  COMPREHENSIVE METABOLIC PANEL  TROPONIN I  CBG MONITORING, ED  PREPARE PLATELET PHERESIS    ____________________________________________   EKG  ED ECG REPORT I, Asyia Hornung W, the attending physician, personally  viewed and interpreted this ECG.   Date: 09/25/2015  EKG Time: 6:22 AM  Rate: 78  Rhythm:  Normal sinus rhythm  Axis: Normal  Intervals: Normal  ST&T Change: Minimal ST depression in V4 through V6.   ____________________________________________    RADIOLOGY  CT head:  IMPRESSION: Acute parenchymal hematoma over the right temporal lobe measuring 3.3 x 6.2 cm with moderate adjacent edema and mass effect with mild leftward uncal herniation and moderate mass effect on the brainstem. Adjacent areas of acute subarachnoid hemorrhage within the right temporal and occipital lobe as well as smaller foci of acute parenchymal hemorrhage more superiorly over the right parietal region. Small acute subdural hematoma over the right convexity measuring 8.5 mm in  thickness. Moderate mass effect on the right hemisphere with midline shift of 12 mm to the left.  X-ray chest status post intubation:   IMPRESSION: 1. Endotracheal tube and NG tube in good position. 2. New RIGHT lower lobe atelectasis and effusion with significant volume loss on the RIGHT   ____________________________________________   PROCEDURES INTUBATION Performed by: Ahmed Prima  Required items: required blood products, implants, devices, and special equipment available Patient identity confirmed: provided demographic data and hospital-assigned identification number Time out: Immediately prior to procedure a "time out" was called to verify the correct patient, procedure, equipment, support staff and site/side marked as required.  Indications: Seizure-like activity, sonorous respirations, need to secure her airway.   Intubation method: Glidescope Laryngoscopy   Preoxygenation: Nasopharyngeal airway placed with 100% oxygen by nonrebreather. Patient was then supported with BVM after she received rapid sequence intubation medications.   Sedatives: Pretreated with Versed 2 mg due to her seizure activity and then with etomidate 20 mg Paralytic: Rocuronium, 100 mg   Tube Size: 7.5 cuffed  Post-procedure assessment: chest rise and ETCO2 monitor Breath sounds: equal and absent over the epigastrium Tube secured with: ETT holder Chest x-ray interpreted by radiologist and me.  Chest x-ray findings: endotracheal tube in appropriate position  Patient tolerated the procedure well with no immediate complications.   CRITICAL CARE Performed by: Ahmed Prima   Total critical care time: 90 minutes due to arriving in critical condition, unstable, with seizure-like activity, inadequate airway, and complex differential diagnosis. I'm also spent speaking with neurology and family and arranging for transfer.  Critical care time was exclusive of separately billable procedures and  treating other patients.  Critical care was necessary to treat or prevent imminent or life-threatening deterioration.  Critical care was time spent personally by me on the following activities: development of treatment plan with patient and/or surrogate as well as nursing, discussions with consultants, evaluation of patient's response to treatment, examination of patient, obtaining history from patient or surrogate, ordering and performing treatments and interventions, ordering and review of laboratory studies, ordering and review of radiographic studies, pulse oximetry and re-evaluation of patient's condition.   ____________________________________________   INITIAL IMPRESSION / ASSESSMENT AND PLAN / ED COURSE  Pertinent labs & imaging results that were available during my care of the patient were reviewed by me and considered in my medical decision making (see chart for details).  Due to the patient's critical state and seizure-like activity, she was initially treated with Versed, 2 mg, to attempt to abate what we believe is seizures. I also ordered Keppra, 1500 mg IV. We place a nasopharyngeal airway in her left naris to help with oxygenation and continued with an oxygen nonrebreather. We then moved to intubation as noted above. After receiving rapid sequence intubation  medications but before the intubation, the patient's blood pressure jumped up and so did her heart rate. Heart rate was proximally 140. We moved forward with intubation and placed a 7.5 tube at 20 cm at the lip.  Status post intubation, the patient received an additional 3 mg of Versed, given the fact that her blood pressure externally tolerated and were concerned about ongoing seizure that was then possibly disguised by the rocuronium she received.   It is unclear how long the patient may have been seizing as she was found in this condition by her family.   ----------------------------------------- 7:20 AM on  09/25/2015 -----------------------------------------  The patient is not seizing at this time. She is breathing 23 times a minute, which is faster than the event. She is no longer paralyzed from the rocuronium. She has flaccid extremities with no seizure-like activity seen.  She continues to be tachycardic and hypertensive. We have continued sedation, switching to Ativan and she is receiving fentanyl, 100 g  I've spoken with Dr. Irish Elders, neurology, who agrees with her actions to date and recommends Keppra, 1000 mg, twice a day, and propofol for sedation. He will see her in the hospital.  ----------------------------------------- 7:48 AM on 09/25/2015 -----------------------------------------  I have met with the patient's sister and roommate and reviewed her recent care and the situation this morning.    The CT scan has been completed. I taken a direct look at the images myself. There appears to be a hemorrhage in the right side with midline shift to the left. The formal radiology read is pending, however we will move towards transfer. I am also moving to control the patient's blood pressure more aggressively. I have ordered 81m  of labetalol and will repeat as needed.  ----------------------------------------- 8:02 AM on 09/25/2015 -----------------------------------------  I discussed the CT results with the radiologist on-call. The patient has an area of hemorrhage in the right parietal as well as a subdural hematoma and blood near the right temple. She has approximate 12 mm of midline shift. I discussed this with the patient's sister and the patient's roommate and discuss transfer with him. They would be most comfortable [facility] are calling [facility] at this time.  ----------------------------------------- 8:21 AM on 09/25/2015 -----------------------------------------  [facility] the patient in  transfer. He is asked for me to be an transfusion of platelets due to the patient's use of Plavix and aspirin. Due to her critical condition I am opting to fly the patient by helicopter if available.  After my discussion with Dr. EKathie Dike I called our blood bank and found out that [facility] and it takes one half hours to 2 hours to receive some. Given that timeframe, I've counseled my order for platelets, as [facility].  I will ask my colleague, Dr. KCorky Downs to assume care of the patient [facility].  ____________________________________________   FINAL CLINICAL IMPRESSION(S) / ED DIAGNOSES  Final diagnoses:  Status epilepticus ([place]  Intracranial hemorrhage ([place] [place]  Atelectasis of right lung  Subdural hematoma ([facility]      DAhmed Prima MD 09/25/15 [phone]

## 2015-09-25 NOTE — ED Notes (Signed)
Per Sister, patient had a stroke 3 weeks ago and normally does not have any deficits, but has noticed increased weakness to the left side.  Was here on Friday per sister and patient did fall and hit her head. Pt is currently on Plavix.  Per sister, patient was doing fine on Thursday, but noticed a change on Friday.

## 2015-09-25 NOTE — ED Notes (Signed)
16 foley catheter placed

## 2015-09-29 DEATH — deceased

## 2016-10-31 IMAGING — CR DG ABDOMEN 3V
1 series · 4 of 4 positions shown · non-contrast
Comparison: None.

CLINICAL DATA: Abdominal pain, nausea and vomiting

EXAM:
ABDOMEN SERIES

[Series 2: x chest ap · 0.14mm/px · 4 of 4 slices shown]
[im 1/4]
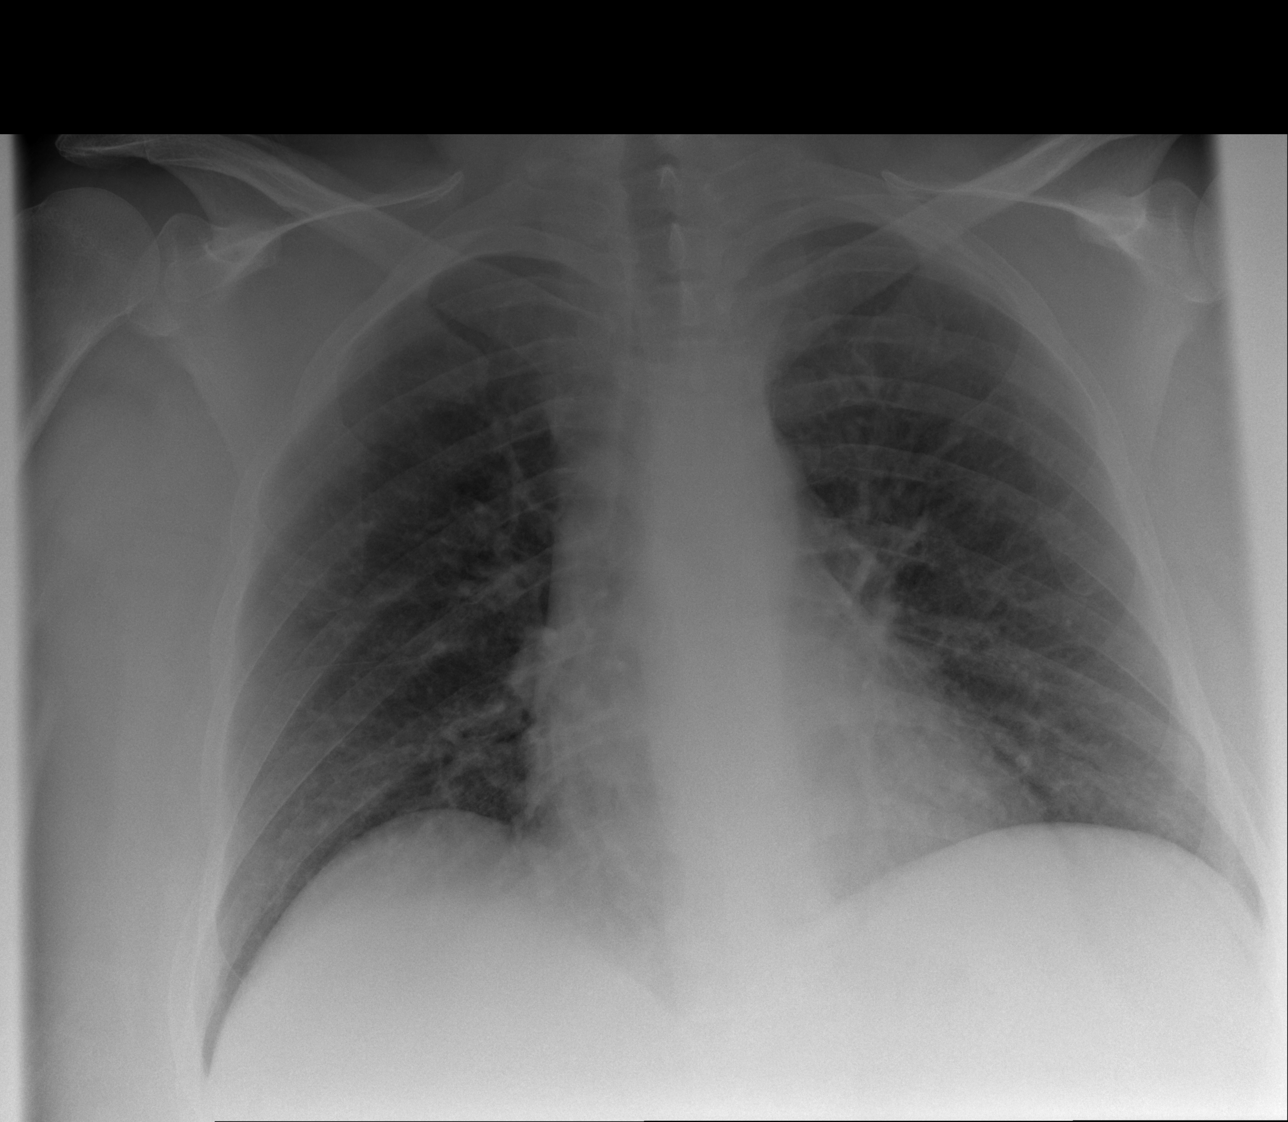
[im 2/4]
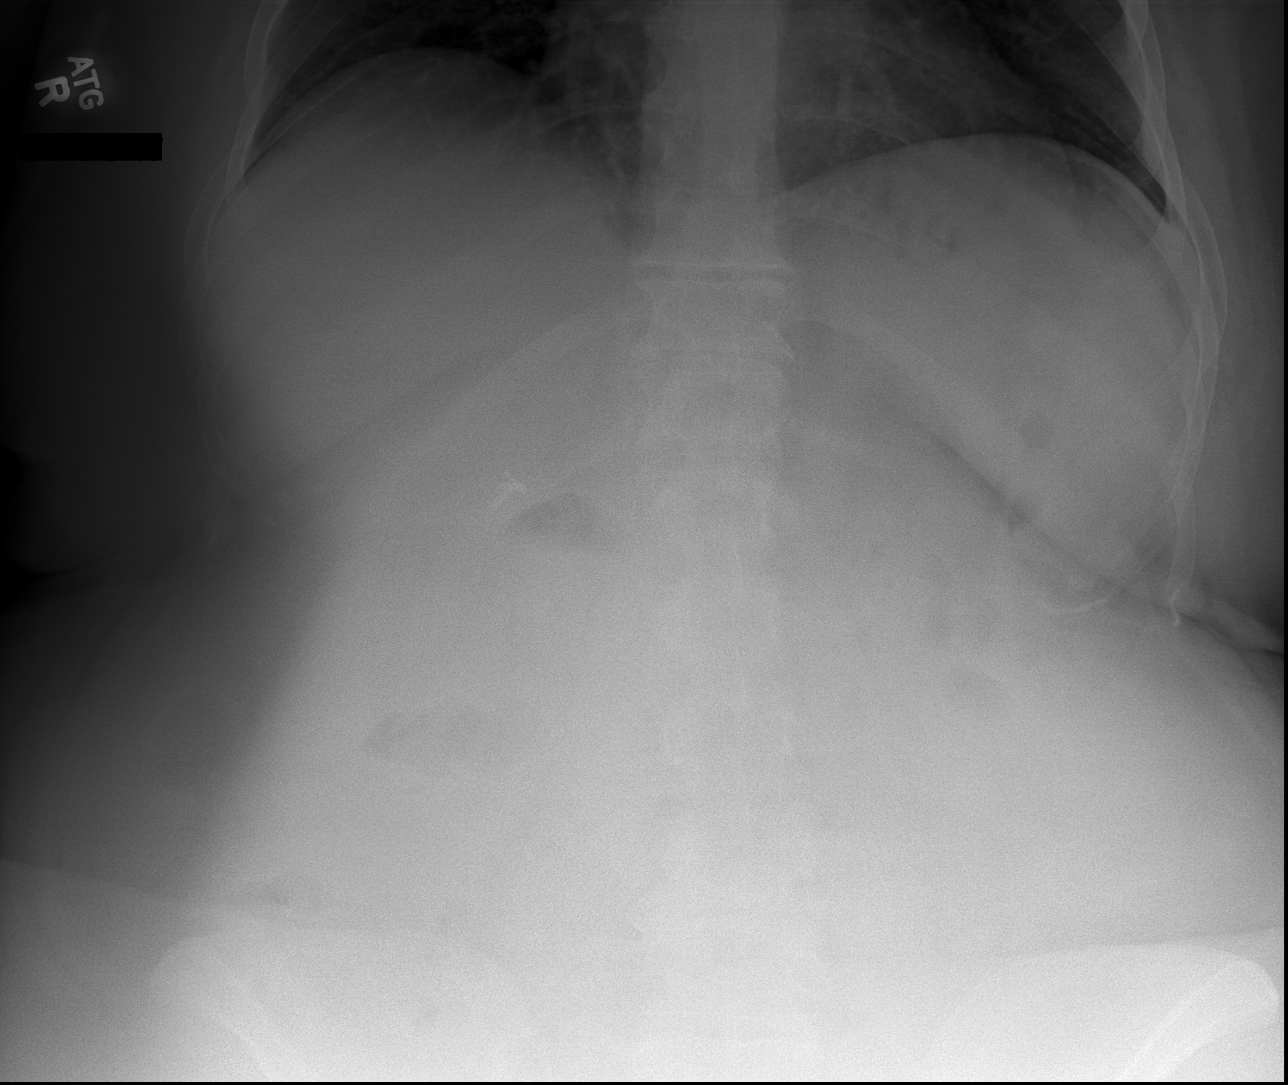
[im 3/4]
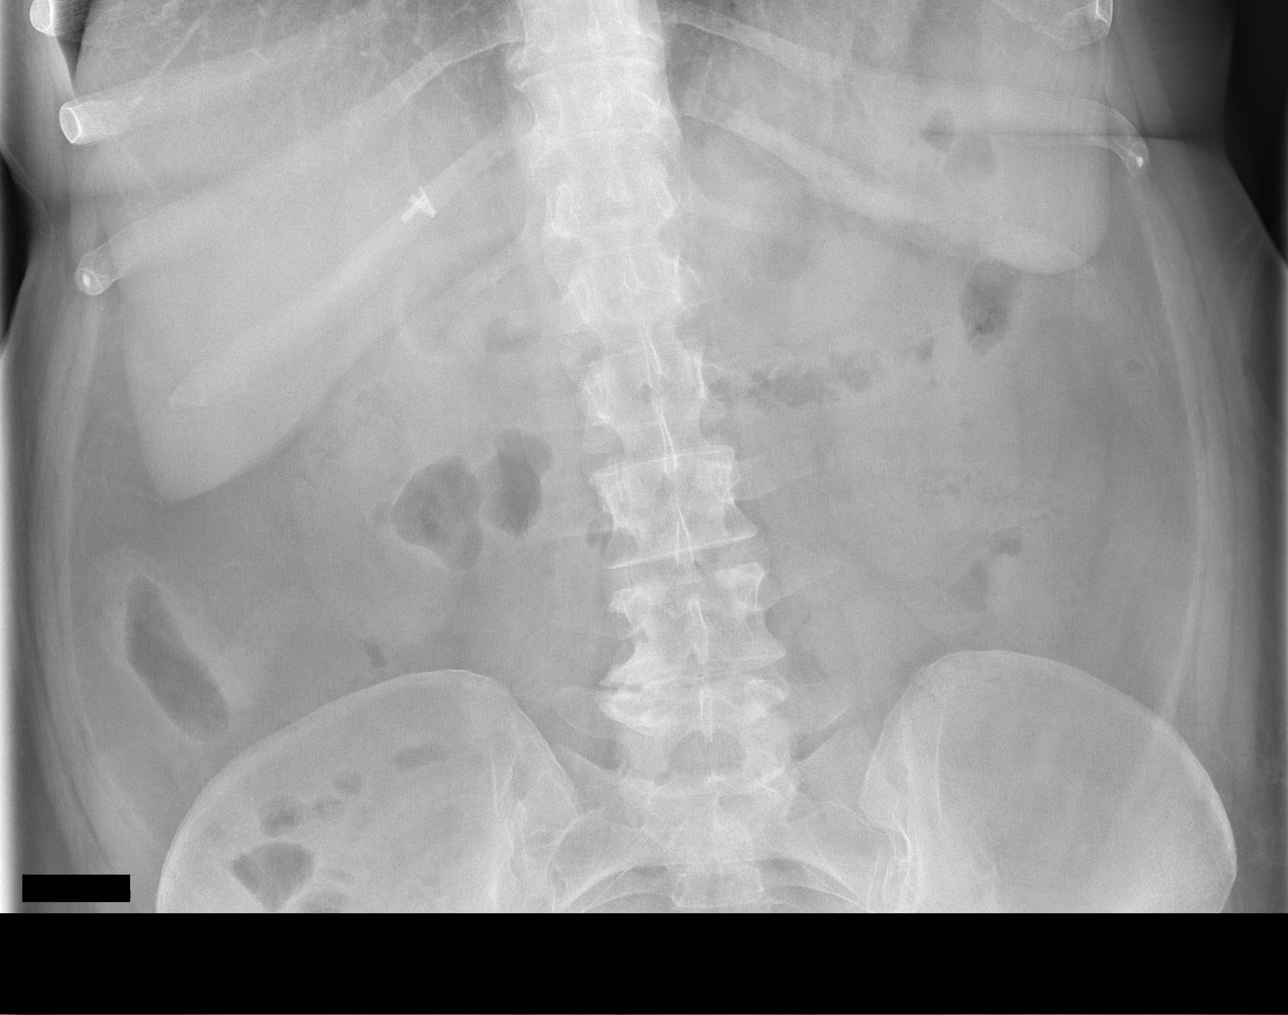
[im 4/4]
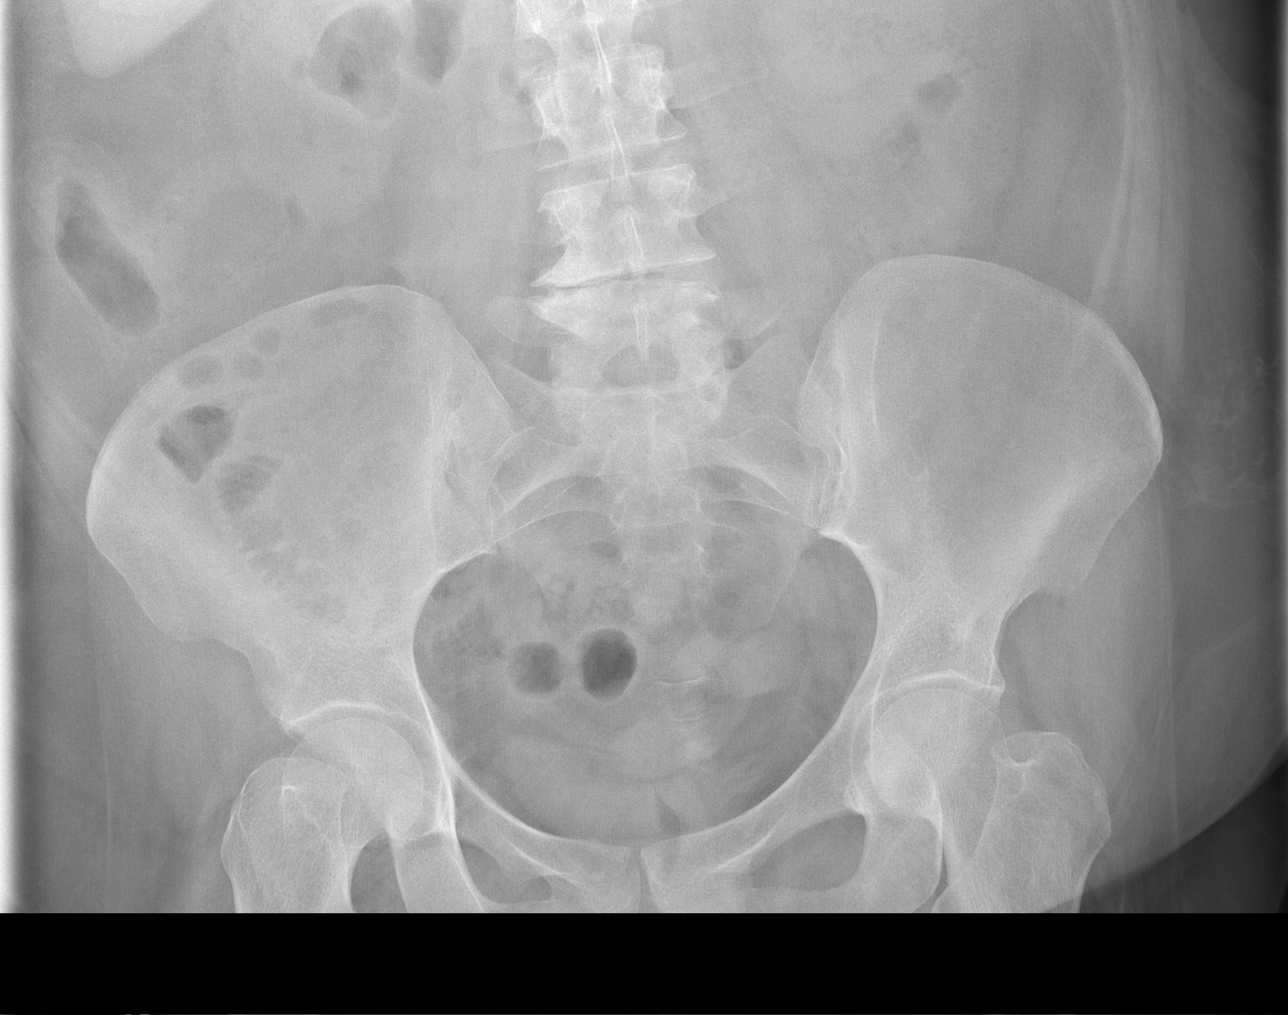

[4 of 4 positions shown; findings below may reference images not displayed]

FINDINGS: Cardiac shadow is within normal limits. The lungs are clear
bilaterally. Scattered large and small bowel gas is noted. No free
air is noted within the abdomen. No abnormal mass or abnormal
calcifications are noted. Degenerative change of the lumbar spine is
seen.
IMPRESSION: No acute abnormality in the chest and abdomen.

## 2017-10-21 IMAGING — CR DG CHEST 2V
2 series · 2 of 2 positions shown · non-contrast
Comparison: July 26, 2014.

CLINICAL DATA: Chest pain.

EXAM:
CHEST  2 VIEW

[chest pa]
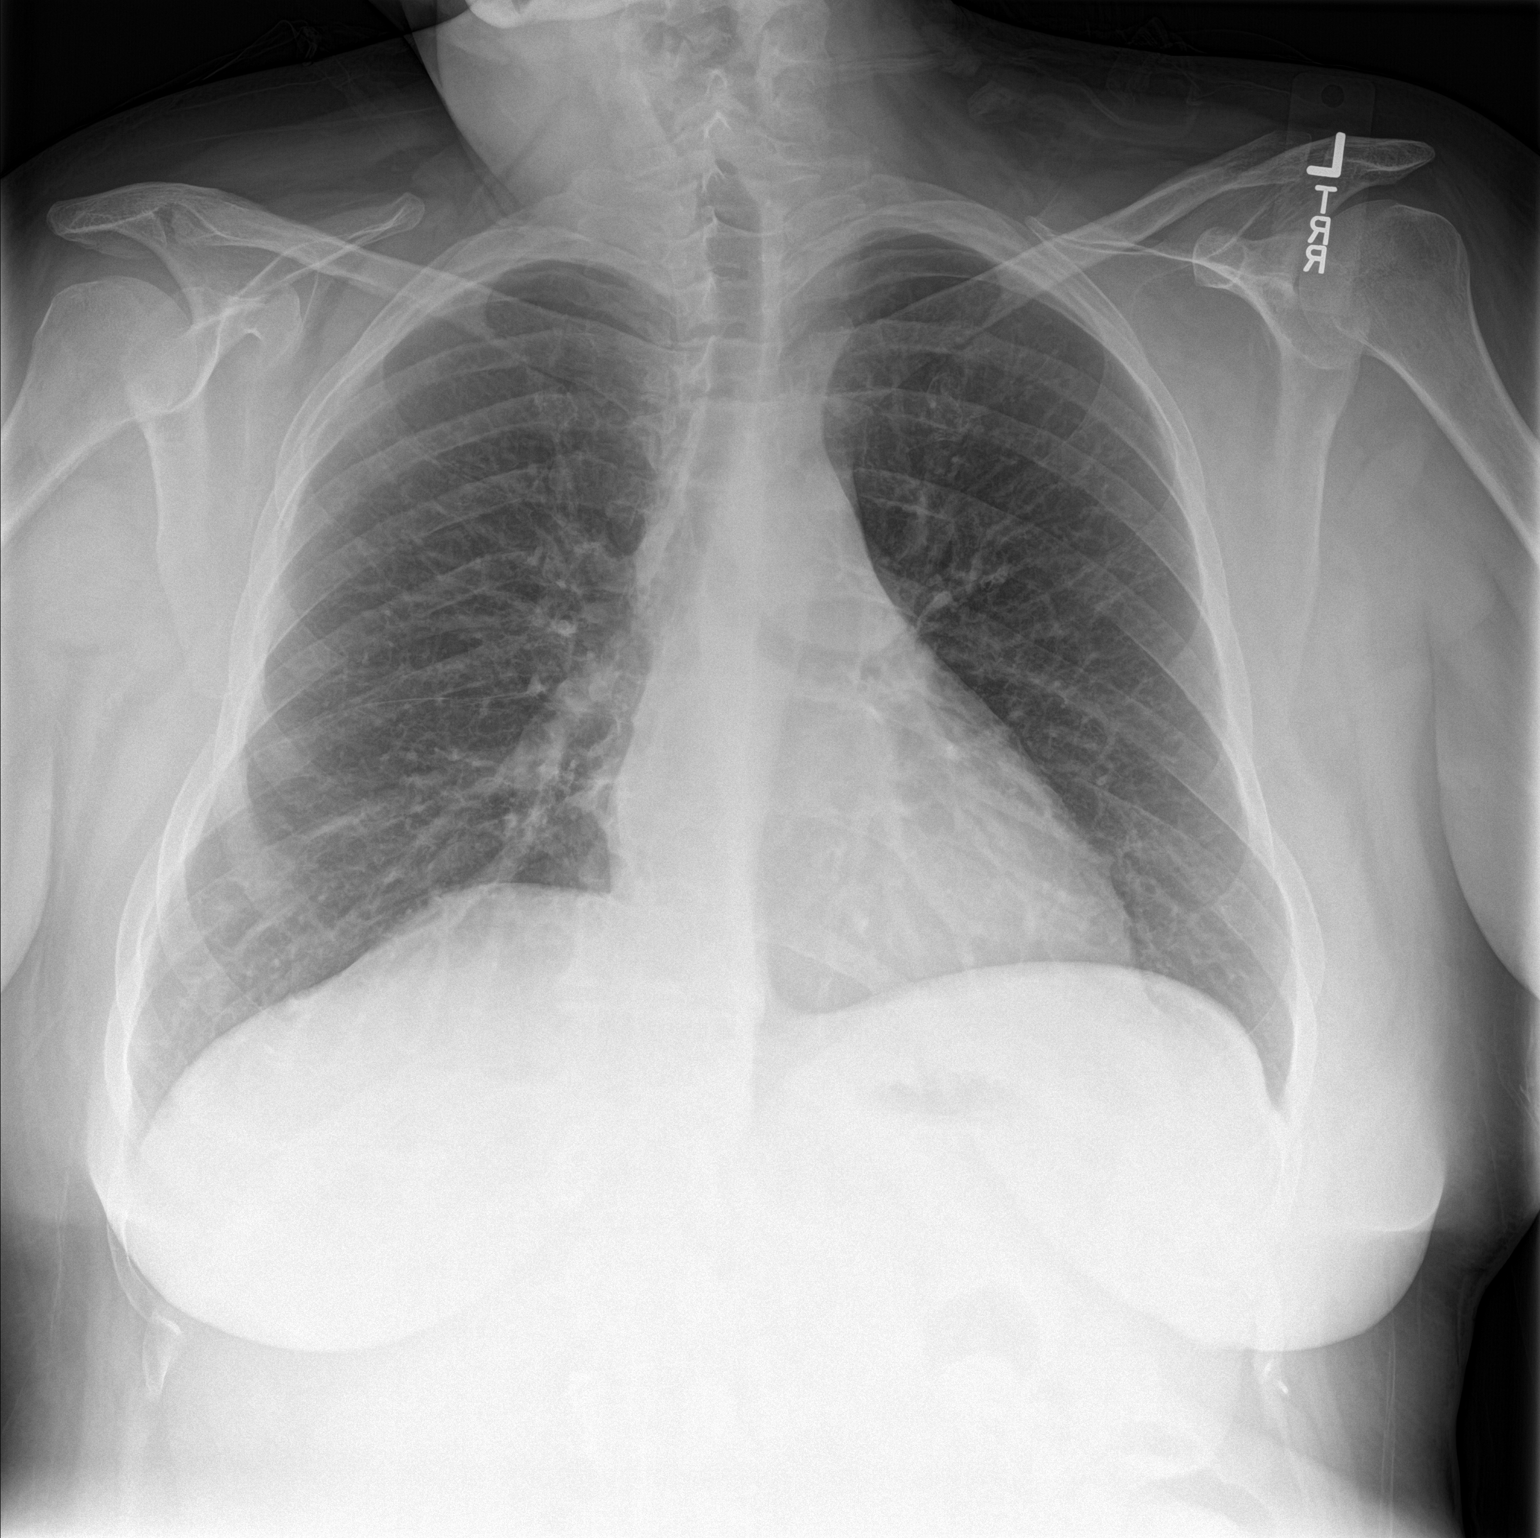

[chest lat]
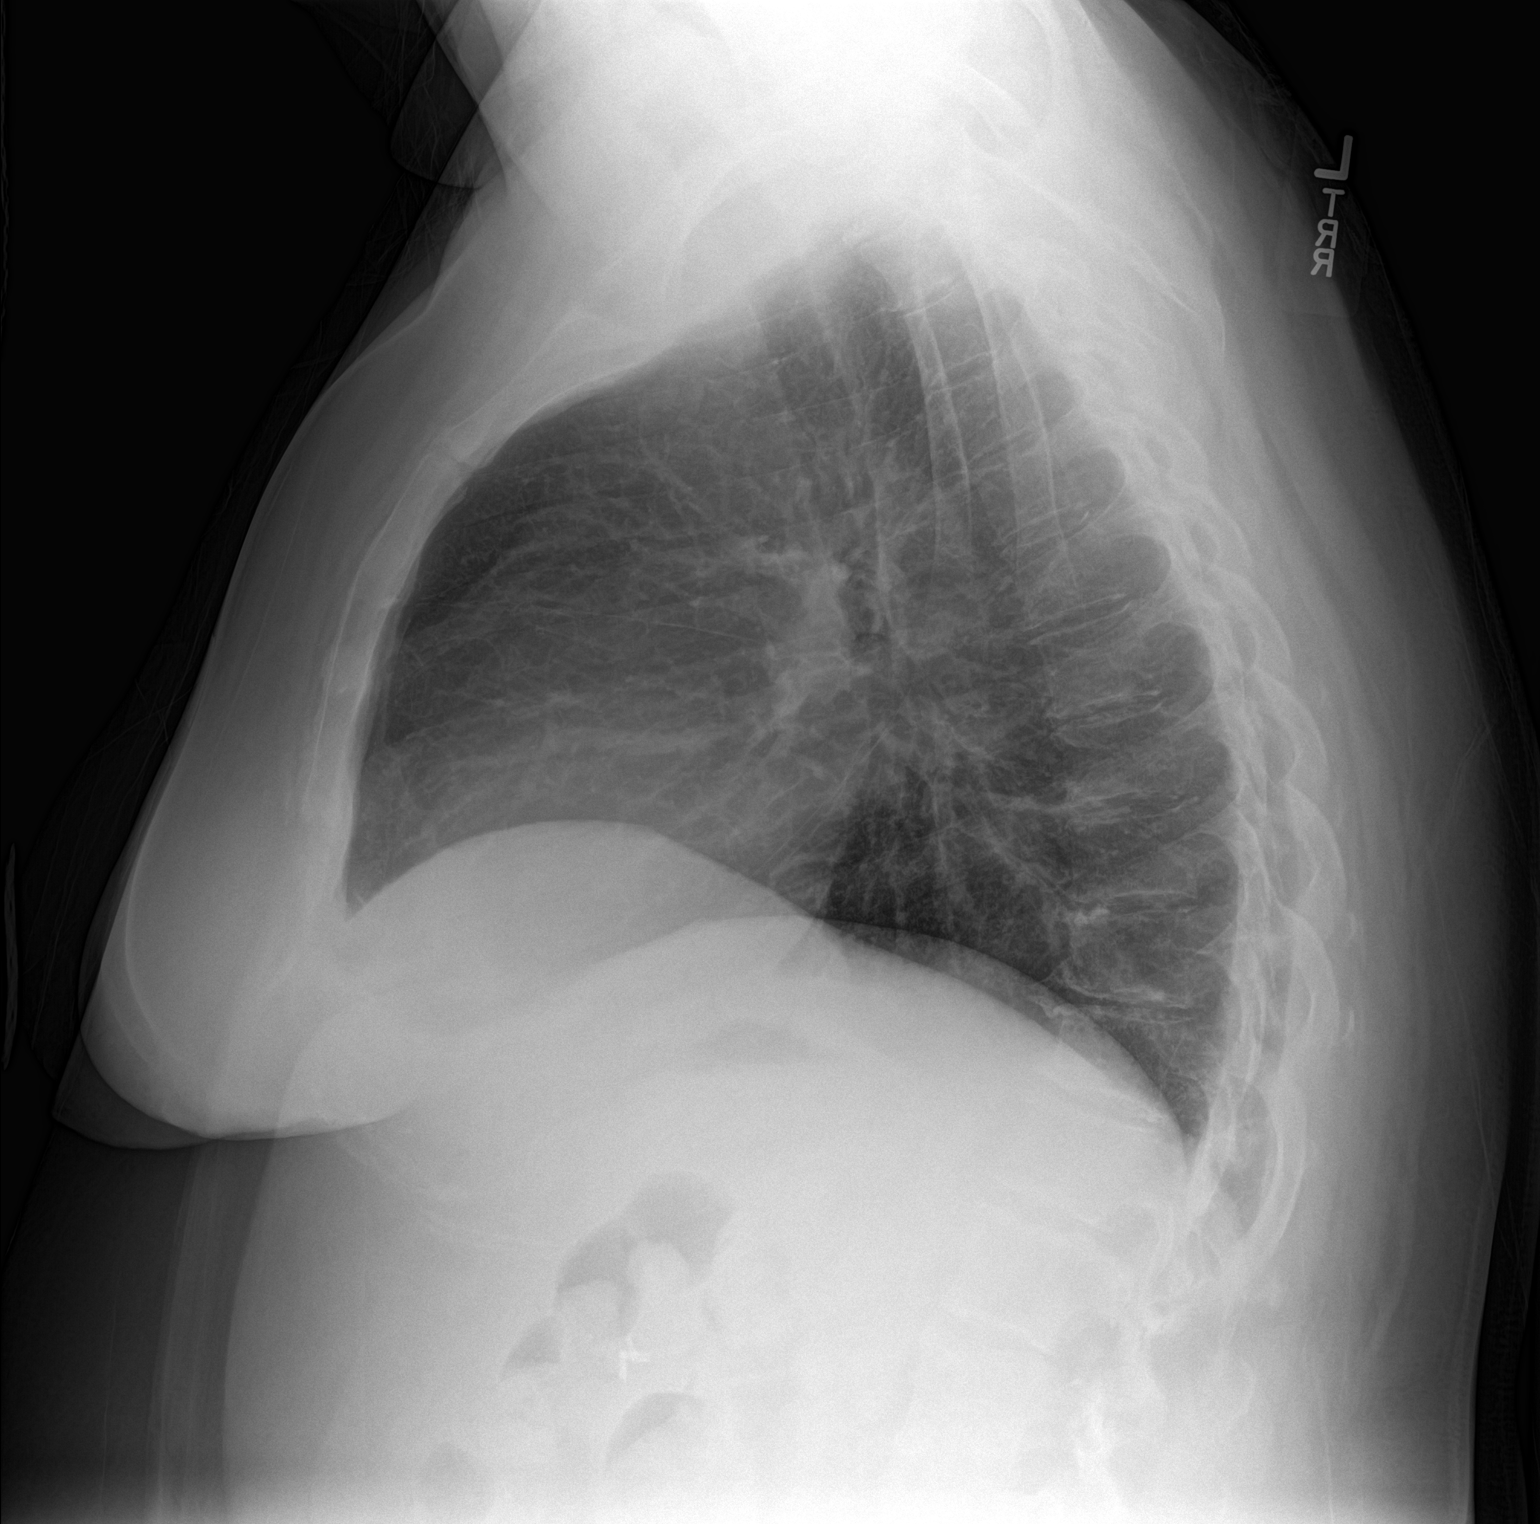

[2 of 2 positions shown; findings below may reference images not displayed]

FINDINGS: The heart size and mediastinal contours are within normal limits.
Both lungs are clear. No pneumothorax or pleural effusion is noted.
The visualized skeletal structures are unremarkable.
IMPRESSION: No active cardiopulmonary disease.

## 2017-10-25 IMAGING — CT CT ANGIO NECK
2 of 7 series · 9 of 33 positions shown · IV contrast (APPLIED)
Comparison: Carotid ultrasound 08/23/2015

CLINICAL DATA: Carotid artery stenosis. Left-sided weakness.
History of diabetes, hypertension, and hyperlipidemia.

EXAM:
CT ANGIOGRAPHY NECK
TECHNIQUE: Multidetector CT imaging of the neck was performed using the
standard protocol during bolus administration of intravenous
contrast. Multiplanar CT image reconstructions and MIPs were
obtained to evaluate the vascular anatomy. Carotid stenosis
measurements (when applicable) are obtained utilizing NASCET
criteria, using the distal internal carotid diameter as the
denominator.
CONTRAST:  100mL OMNIPAQUE IOHEXOL 350 MG/ML SOLN

[Series 4: cta neck · axial · 0.56mm/px · z∈[-355,-211]mm · 5 of 216 slices shown]
[im 36/216  soft-tissue]
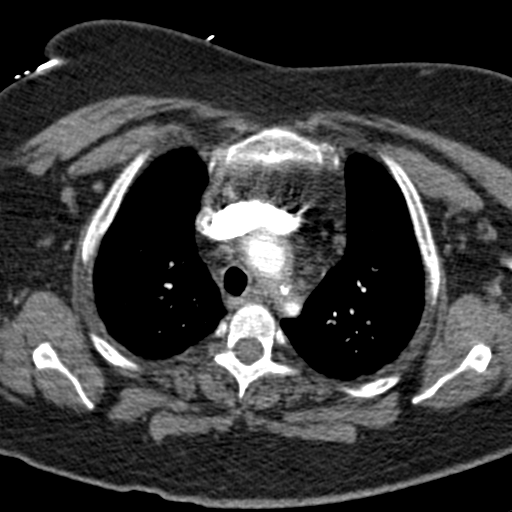
[im 72/216  bone]
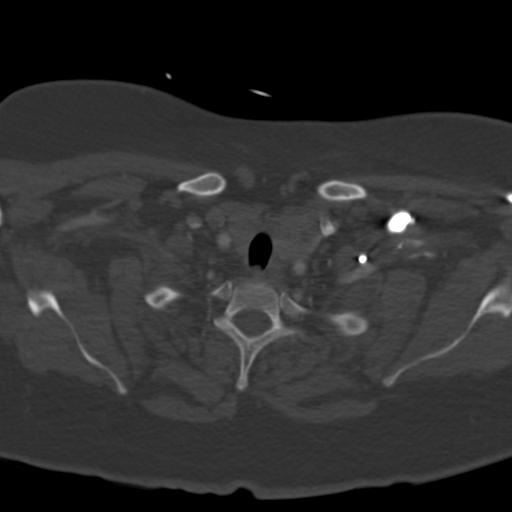
[im 108/216  soft-tissue]
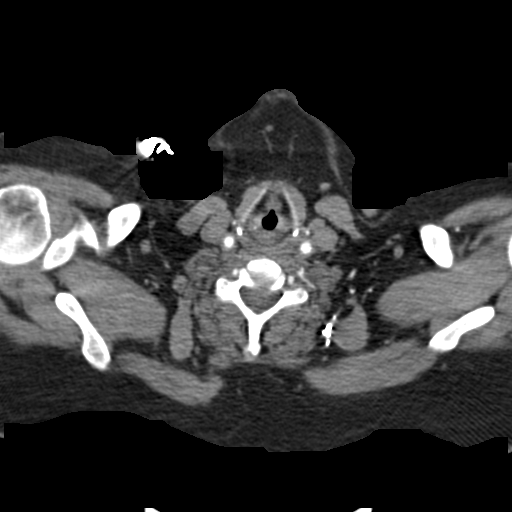
[im 144/216  bone]
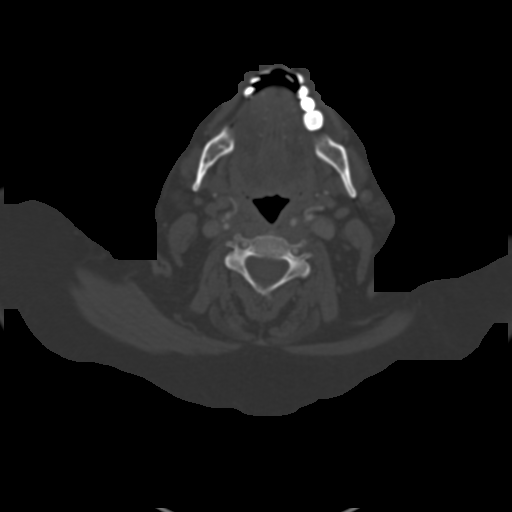
[im 180/216  soft-tissue]
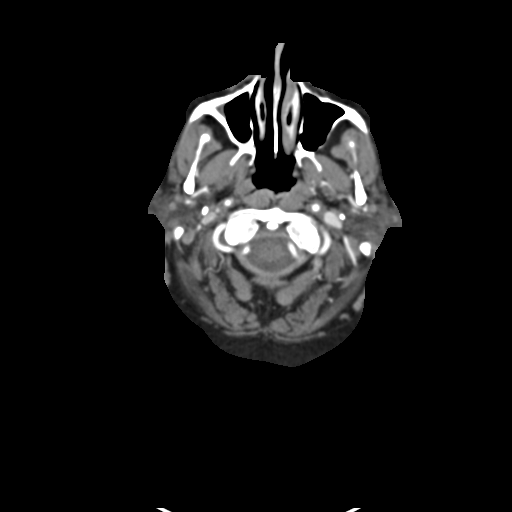

[Series 6: ax thin · axial · 0.56mm/px · z∈[-347,-219]mm · 4 of 214 slices shown]
[im 43/214  soft-tissue]
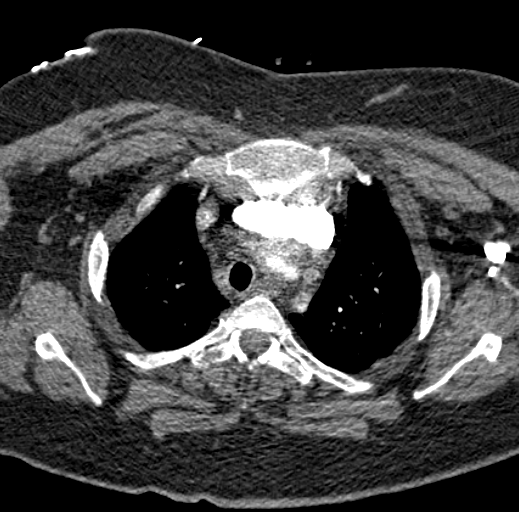
[im 86/214  soft-tissue]
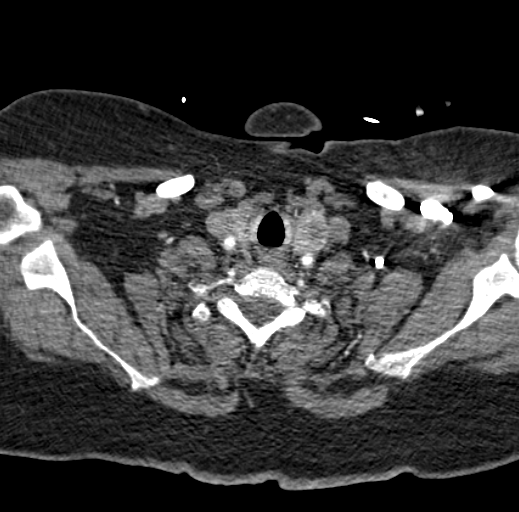
[im 128/214  soft-tissue]
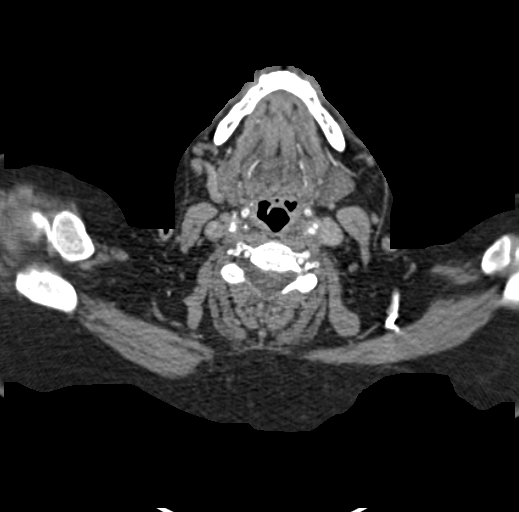
[im 171/214  soft-tissue]
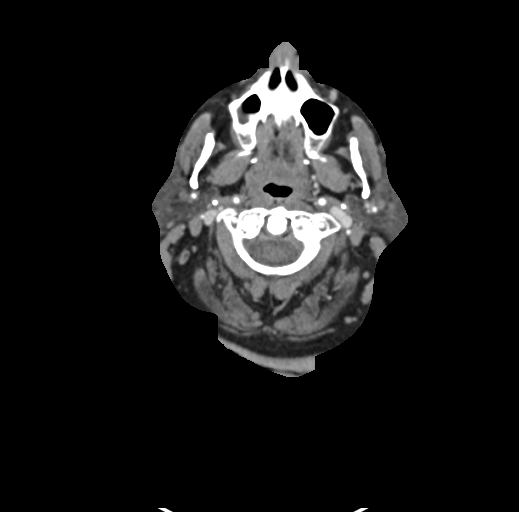

[9 of 33 positions shown; findings below may reference images not displayed]

FINDINGS: Examination is mildly limited by patient body habitus.

Aortic arch: Normal variant aortic arch branching pattern with
common origin of the brachiocephalic and left common carotid
arteries noted. There is mild calcified plaque in the distal aortic
arch. There is also mild to moderate circumferential soft tissue
partially visualized about the distal arch and proximal descending
thoracic aorta as well as involving the proximal arch vessels. There
is approximately 70% stenosis of the proximal right subclavian
artery near its origin, and there is also approximately 70% stenosis
of the left subclavian artery approximately 1.5 cm beyond its
origin.

Right carotid system: Common carotid artery is patent with mild wall
thickening. There is high-grade stenosis of the internal carotid
artery at its origin of greater than 80%. The cervical ICA is
diffusely small in caliber overall and slightly smaller in caliber
compared to the contralateral side.

Left carotid system: Common carotid artery is patent with mild
diffuse wall thickening. There is 40% stenosis of the proximal ICA.
Mild diffuse soft tissue thickening is present about the ICA as
well.

Vertebral arteries: Vertebral arteries are patent and equal in size
without stenosis.

Skeleton: No acute osseous abnormality.

Other neck: Evidence of chronic right maxillary sinusitis with mild
mucosal thickening currently. Small left mastoid effusion.
Heterogeneous thyroid diffusely with asymmetric enlargement of the
left lobe corresponding to the 3 cm nodule described on ultrasound
earlier today.
IMPRESSION: 1. High-grade, greater than 80% stenosis of the proximal right
internal carotid artery.
2. 40% proximal left ICA stenosis.
3. Approximately 70% stenosis of both subclavian arteries.
4. While these stenoses may reflect atherosclerotic disease given
patient's medical history, there is evidence of diffuse wall
thickening involving a portion of the aorta as well as proximal arch
vessels and carotid arteries and a large vessel vasculitis is a
consideration.
